# Patient Record
Sex: Male | Born: 1960 | Race: White | Hispanic: No | Smoking: Former smoker
Health system: Southern US, Community
[De-identification: ages and names within clinical notes are randomized; demographics above are authoritative.]

## PROBLEM LIST (undated history)

## (undated) DIAGNOSIS — I739 Peripheral vascular disease, unspecified: Secondary | ICD-10-CM

## (undated) DIAGNOSIS — T8859XA Other complications of anesthesia, initial encounter: Secondary | ICD-10-CM

## (undated) DIAGNOSIS — K219 Gastro-esophageal reflux disease without esophagitis: Secondary | ICD-10-CM

## (undated) DIAGNOSIS — F419 Anxiety disorder, unspecified: Secondary | ICD-10-CM

## (undated) DIAGNOSIS — F32A Depression, unspecified: Secondary | ICD-10-CM

## (undated) DIAGNOSIS — S43439A Superior glenoid labrum lesion of unspecified shoulder, initial encounter: Secondary | ICD-10-CM

## (undated) DIAGNOSIS — K5790 Diverticulosis of intestine, part unspecified, without perforation or abscess without bleeding: Secondary | ICD-10-CM

## (undated) DIAGNOSIS — M1990 Unspecified osteoarthritis, unspecified site: Secondary | ICD-10-CM

## (undated) DIAGNOSIS — M5126 Other intervertebral disc displacement, lumbar region: Secondary | ICD-10-CM

## (undated) DIAGNOSIS — F329 Major depressive disorder, single episode, unspecified: Secondary | ICD-10-CM

## (undated) DIAGNOSIS — J45909 Unspecified asthma, uncomplicated: Secondary | ICD-10-CM

## (undated) DIAGNOSIS — C801 Malignant (primary) neoplasm, unspecified: Secondary | ICD-10-CM

## (undated) DIAGNOSIS — J449 Chronic obstructive pulmonary disease, unspecified: Secondary | ICD-10-CM

## (undated) HISTORY — DX: Depression, unspecified: F32.A

## (undated) HISTORY — DX: Anxiety disorder, unspecified: F41.9

## (undated) HISTORY — DX: Major depressive disorder, single episode, unspecified: F32.9

---

## 2014-06-19 ENCOUNTER — Other Ambulatory Visit: Payer: Self-pay | Admitting: *Deleted

## 2014-06-19 ENCOUNTER — Encounter: Payer: Self-pay | Admitting: Surgery

## 2014-06-19 DIAGNOSIS — I70213 Atherosclerosis of native arteries of extremities with intermittent claudication, bilateral legs: Secondary | ICD-10-CM

## 2014-07-16 ENCOUNTER — Ambulatory Visit (INDEPENDENT_AMBULATORY_CARE_PROVIDER_SITE_OTHER): Payer: 59 | Admitting: Cardiovascular Disease

## 2014-07-16 ENCOUNTER — Encounter: Payer: Self-pay | Admitting: Cardiovascular Disease

## 2014-07-16 ENCOUNTER — Ambulatory Visit (HOSPITAL_COMMUNITY): Payer: 59 | Attending: Cardiovascular Disease | Admitting: Cardiology

## 2014-07-16 VITALS — BP 112/66 | HR 62 | Ht 72.0 in | Wt 203.8 lb

## 2014-07-16 DIAGNOSIS — I700 Atherosclerosis of aorta: Secondary | ICD-10-CM

## 2014-07-16 DIAGNOSIS — I739 Peripheral vascular disease, unspecified: Secondary | ICD-10-CM | POA: Insufficient documentation

## 2014-07-16 DIAGNOSIS — Z720 Tobacco use: Secondary | ICD-10-CM

## 2014-07-16 LAB — BASIC METABOLIC PANEL
BUN: 12 mg/dL (ref 6–23)
CO2: 29 mEq/L (ref 19–32)
Calcium: 9.8 mg/dL (ref 8.4–10.5)
Chloride: 105 mEq/L (ref 96–112)
Creatinine, Ser: 1.08 mg/dL (ref 0.40–1.50)
GFR: 75.13 mL/min (ref >60.00)
Glucose, Bld: 96 mg/dL (ref 70–99)
Potassium: 4 mEq/L (ref 3.5–5.1)
Sodium: 138 mEq/L (ref 135–145)

## 2014-07-16 LAB — CBC
HCT: 51.7 % (ref 39.0–52.0)
Hemoglobin: 17.4 g/dL — ABNORMAL HIGH (ref 13.0–17.0)
MCHC: 33.7 g/dL (ref 30.0–36.0)
MCV: 86.5 fl (ref 78.0–100.0)
Platelets: 240 10*3/uL (ref 150.0–400.0)
RBC: 5.98 Mil/uL — ABNORMAL HIGH (ref 4.22–5.81)
RDW: 13.5 % (ref 11.5–15.5)
WBC: 10.9 10*3/uL — ABNORMAL HIGH (ref 4.0–10.5)

## 2014-07-16 LAB — PROTIME-INR
INR: 1 ratio (ref 0.8–1.0)
Prothrombin Time: 10.9 s (ref 9.6–13.1)

## 2014-07-16 MED ORDER — ASPIRIN EC 81 MG PO TBEC: 81.0000 mg | DELAYED_RELEASE_TABLET | Freq: Every day | ORAL | Status: AC

## 2014-07-16 NOTE — Progress Notes (Signed)
Abdominal Aorta Duplex performed  

## 2014-07-16 NOTE — Patient Instructions (Addendum)
Medication Instructions:  Your physician has recommended you make the following change in your medication:  1. START Aspirin 81mg  take one by mouth daily  Labwork: Your physician recommends that you have lab work today: CBC, BMP, PT/INR  Testing/Procedures: Your physician has recommended an aorto-iliac duplex.   Your physician has requested that you have a peripheral vascular angiogram. This exam is performed at the hospital. During this exam IV contrast is used to look at arterial blood flow. Please review the information sheet given for details.  Follow-Up: We will arrange further follow-up after your angiogram.   Any Other Special Instructions Will Be Listed Below (If Applicable).

## 2014-07-19 ENCOUNTER — Encounter: Payer: Self-pay | Admitting: Vascular Surgery

## 2014-07-19 ENCOUNTER — Other Ambulatory Visit (HOSPITAL_COMMUNITY): Payer: Self-pay

## 2014-07-20 ENCOUNTER — Encounter: Payer: Self-pay | Admitting: Cardiovascular Disease

## 2014-07-20 DIAGNOSIS — Z720 Tobacco use: Secondary | ICD-10-CM | POA: Insufficient documentation

## 2014-07-20 DIAGNOSIS — I739 Peripheral vascular disease, unspecified: Secondary | ICD-10-CM | POA: Insufficient documentation

## 2014-07-20 NOTE — Assessment & Plan Note (Signed)
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 His symptoms are significantly lifestyle limiting. I discussed with him the natural history of claudication and different management options. I advised him to start taking aspirin 81 mg once daily. Given the significant limitations due to this, I recommend proceeding with abdominal aortogram, lower extremity runoff and possible endovascular intervention. Risks and benefits were discussed with the patient. I requested an aortoiliac duplex to be done before the procedure.

## 2014-07-20 NOTE — Progress Notes (Signed)
Primary care physician: Dr. Sasser  HPI  This is a pleasant 54-year-old male who was referred for evaluation and management of claudication and peripheral arterial disease. He has no previous cardiac history. He has prolonged history of tobacco use and reports smoking one and a half pack per day since he was 54 years old. He is currently in the process of quitting. He has no history of diabetes, hypertension or hyperlipidemia. He is not aware of any previous cardiac history. He reports progressive bilateral lower back and thigh numbness and heaviness with minimal walking. He starts having discomfort after walking about 50-100 feet and then he has to rest for he can resume. He denies any chest pain or shortness of breath. He had noninvasive vascular evaluation done at [facility] which showed a right ABI of 0.67 and left ABI of 0.88 with monophasic waveform throughout suggestive of inflow disease worse on the right side. He has no lower extremity ulceration.  No Known Allergies   Current Outpatient Prescriptions on File Prior to Visit  Medication Sig Dispense Refill  . clonazePAM (KLONOPIN) 1 MG tablet Take 1 mg by mouth 3 (three) times daily as needed for anxiety.    . pantoprazole (PROTONIX) 40 MG tablet Take 40 mg by mouth daily.     No current facility-administered medications on file prior to visit.     Past Medical History  Diagnosis Date  . Depression   . Anxiety      No past surgical history on file.   Family History  Problem Relation Age of Onset  . Diabetes Mother   . Cancer Father     Throat     History   Social History  . Marital Status: Legally Separated    Spouse Name: N/A  . Number of Children: N/A  . Years of Education: N/A   Occupational History  . Not on file.   Social History Main Topics  . Smoking status: Light Tobacco Smoker  . Smokeless tobacco: Not on file  . Alcohol Use: No  . Drug Use: No  . Sexual Activity: Not on file   Other  Topics Concern  . Not on file   Social History Narrative     ROS A 10 point review of system was performed. It is negative other than that mentioned in the history of present illness.   PHYSICAL EXAM   BP 112/66 mmHg  Pulse 62  Ht 6' (1.829 m)  Wt 203 lb 12.8 oz (92.443 kg)  BMI 27.63 kg/m2 Constitutional: He is oriented to person, place, and time. He appears well-developed and well-nourished. No distress.  HENT: No nasal discharge.  Head: Normocephalic and atraumatic.  Eyes: Pupils are equal and round.  No discharge. Neck: Normal range of motion. Neck supple. No JVD present. No thyromegaly present.  Cardiovascular: Normal rate, regular rhythm, normal heart sounds. Exam reveals no gallop and no friction rub. No murmur heard.  Pulmonary/Chest: Effort normal and breath sounds normal. No stridor. No respiratory distress. He has no wheezes. He has no rales. He exhibits no tenderness.  Abdominal: Soft. Bowel sounds are normal. He exhibits no distension. There is no tenderness. There is no rebound and no guarding.  Musculoskeletal: Normal range of motion. He exhibits no edema and no tenderness.  Neurological: He is alert and oriented to person, place, and time. Coordination normal.  Skin: Skin is warm and dry. No rash noted. He is not diaphoretic. No erythema. No pallor.  Psychiatric: He has a normal   mood and affect. His behavior is normal. Judgment and thought content normal.  Vascular: Radial pulses: +2 on the right and +1 on the left. Femoral pulses: Very faint on the right side and barely palpable, +1 on the left side. Distal pulses are not palpable.     EKG: Normal sinus rhythm with no significant ST or T wave changes.   ASSESSMENT AND PLAN    

## 2014-07-20 NOTE — Assessment & Plan Note (Signed)
He is quitting smoking and I explained to him the importance of complete abstinence given the significantly worse outcome in patients with PAD.

## 2014-07-24 ENCOUNTER — Encounter (HOSPITAL_COMMUNITY): Payer: Self-pay | Admitting: Cardiovascular Disease

## 2014-07-24 ENCOUNTER — Ambulatory Visit (HOSPITAL_COMMUNITY)
Admission: RE | Admit: 2014-07-24 | Discharge: 2014-07-24 | Disposition: A | Discharge status: Home / Self Care | Payer: 59 | Source: Ambulatory Visit | Attending: Cardiovascular Disease | Admitting: Cardiovascular Disease

## 2014-07-24 ENCOUNTER — Other Ambulatory Visit: Payer: Self-pay | Admitting: Cardiovascular Disease

## 2014-07-24 ENCOUNTER — Encounter (HOSPITAL_COMMUNITY)
Admission: RE | Disposition: A | Discharge status: Home / Self Care | Payer: Self-pay | Source: Ambulatory Visit | Attending: Cardiovascular Disease

## 2014-07-24 DIAGNOSIS — F419 Anxiety disorder, unspecified: Secondary | ICD-10-CM | POA: Diagnosis not present

## 2014-07-24 DIAGNOSIS — I70213 Atherosclerosis of native arteries of extremities with intermittent claudication, bilateral legs: Secondary | ICD-10-CM | POA: Insufficient documentation

## 2014-07-24 DIAGNOSIS — I700 Atherosclerosis of aorta: Secondary | ICD-10-CM | POA: Insufficient documentation

## 2014-07-24 DIAGNOSIS — Z79899 Other long term (current) drug therapy: Secondary | ICD-10-CM | POA: Insufficient documentation

## 2014-07-24 DIAGNOSIS — F17200 Nicotine dependence, unspecified, uncomplicated: Secondary | ICD-10-CM | POA: Insufficient documentation

## 2014-07-24 DIAGNOSIS — I739 Peripheral vascular disease, unspecified: Secondary | ICD-10-CM

## 2014-07-24 DIAGNOSIS — I745 Embolism and thrombosis of iliac artery: Secondary | ICD-10-CM | POA: Insufficient documentation

## 2014-07-24 HISTORY — PX: PERCUTANEOUS STENT INTERVENTION: SHX5500

## 2014-07-24 HISTORY — PX: ABDOMINAL AORTAGRAM: SHX5454

## 2014-07-24 LAB — POCT ACTIVATED CLOTTING TIME: Activated Clotting Time: 251 seconds

## 2014-07-24 SURGERY — ABDOMINAL AORTAGRAM

## 2014-07-24 MED ORDER — CLOPIDOGREL BISULFATE 300 MG PO TABS
ORAL_TABLET | ORAL | Status: AC
  Filled 2014-07-24: qty 2

## 2014-07-24 MED ORDER — ASPIRIN 81 MG PO CHEW
81.0000 mg | CHEWABLE_TABLET | ORAL | Status: AC
  Administered 2014-07-24: 81 mg via ORAL

## 2014-07-24 MED ORDER — LIDOCAINE HCL (PF) 1 % IJ SOLN
INTRAMUSCULAR | Status: AC
  Filled 2014-07-24: qty 30

## 2014-07-24 MED ORDER — FAMOTIDINE IN NACL 20-0.9 MG/50ML-% IV SOLN
INTRAVENOUS | Status: AC
  Filled 2014-07-24: qty 50

## 2014-07-24 MED ORDER — HEPARIN (PORCINE) IN NACL 2-0.9 UNIT/ML-% IJ SOLN
INTRAMUSCULAR | Status: AC
  Filled 2014-07-24: qty 1000

## 2014-07-24 MED ORDER — SODIUM CHLORIDE 0.9 % IJ SOLN: 3.0000 mL | Freq: Two times a day (BID) | INTRAMUSCULAR | Status: DC

## 2014-07-24 MED ORDER — SODIUM CHLORIDE 0.9 % IJ SOLN: 3.0000 mL | INTRAMUSCULAR | Status: DC | PRN

## 2014-07-24 MED ORDER — HEPARIN SODIUM (PORCINE) 1000 UNIT/ML IJ SOLN
INTRAMUSCULAR | Status: AC
  Filled 2014-07-24: qty 1

## 2014-07-24 MED ORDER — ASPIRIN 81 MG PO CHEW
CHEWABLE_TABLET | ORAL | Status: AC
  Filled 2014-07-24: qty 1

## 2014-07-24 MED ORDER — MIDAZOLAM HCL 2 MG/2ML IJ SOLN
INTRAMUSCULAR | Status: AC
  Filled 2014-07-24: qty 2

## 2014-07-24 MED ORDER — CLOPIDOGREL BISULFATE 75 MG PO TABS: 75.0000 mg | ORAL_TABLET | Freq: Every day | ORAL | Status: DC

## 2014-07-24 MED ORDER — FENTANYL CITRATE (PF) 100 MCG/2ML IJ SOLN
INTRAMUSCULAR | Status: AC
  Filled 2014-07-24: qty 2

## 2014-07-24 MED ORDER — SODIUM CHLORIDE 0.9 % IV SOLN
INTRAVENOUS | Status: DC
  Administered 2014-07-24: 07:00:00 via INTRAVENOUS

## 2014-07-24 MED ORDER — SODIUM CHLORIDE 0.9 % IV SOLN
INTRAVENOUS | Status: DC
  Administered 2014-07-24: 11:00:00 via INTRAVENOUS

## 2014-07-24 MED ORDER — SODIUM CHLORIDE 0.9 % IV SOLN: 250.0000 mL | INTRAVENOUS | Status: DC | PRN

## 2014-07-24 NOTE — Discharge Instructions (Signed)
Start Plavix 75 mg once daily. A prescription was sent to [facility] in [place].   Angiogram, Care After Refer to this sheet in the next few weeks. These instructions provide you with information on caring for yourself after your procedure. Your health care provider may also give you more specific instructions. Your treatment has been planned according to current medical practices, but problems sometimes occur. Call your health care provider if you have any problems or questions after your procedure.  WHAT TO EXPECT AFTER THE PROCEDURE After your procedure, it is typical to have the following sensations:  Minor discomfort or tenderness and a small bump at the catheter insertion site. The bump should usually decrease in size and tenderness within 1 to 2 weeks.  Any bruising will usually fade within 2 to 4 weeks. HOME CARE INSTRUCTIONS   You may need to keep taking blood thinners if they were prescribed for you. Take medicines only as directed by your health care provider.  Do not apply powder or lotion to the site.  Do not take baths, swim, or use a hot tub until your health care provider approves.  You may shower 24 hours after the procedure. Remove the bandage (dressing) and gently wash the site with plain soap and water. Gently pat the site dry.  Inspect the site at least twice daily.  Limit your activity for the first 48 hours. Do not bend, squat, or lift anything over 20 lb (9 kg) or as directed by your health care provider.  Plan to have someone take you home after the procedure. Follow instructions about when you can drive or return to work. SEEK MEDICAL CARE IF:  You get light-headed when standing up.  You have drainage (other than a small amount of blood on the dressing).  You have chills.  You have a fever.  You have redness, warmth, swelling, or pain at the insertion site. SEEK IMMEDIATE MEDICAL CARE IF:   You develop chest pain or shortness of breath, feel faint, or pass  out.  You have bleeding, swelling larger than a walnut, or drainage from the catheter insertion site.  You develop pain, discoloration, coldness, or severe bruising in the leg or arm that held the catheter.  You develop bleeding from any other place, such as the bowels. You may see bright red blood in your urine or stools, or your stools may appear black and tarry.  You have heavy bleeding from the site. If this happens, hold pressure on the site. CALL 911 MAKE SURE YOU:  Understand these instructions.  Will watch your condition.  Will get help right away if you are not doing well or get worse. Document Released: 10/01/2004 Document Revised: 07/30/2013 Document Reviewed: 08/07/2012 [facility] Patient Information 2015 [employer] [employer] [place]. This information is not intended to replace advice given to you by your health care provider. Make sure you discuss any questions you have with your health care provider.

## 2014-07-24 NOTE — Progress Notes (Signed)
Per Dr Kirke CorinArida ok to d/c home now

## 2014-07-24 NOTE — Interval H&P Note (Signed)
History and Physical Interval Note:  07/24/2014 8:47 AM  Terrence Swanson  has presented today for surgery, with the diagnosis of pad  The various methods of treatment have been discussed with the patient and family. After consideration of risks, benefits and other options for treatment, the patient has consented to  Procedure(s): ABDOMINAL AORTAGRAM (N/A) as a surgical intervention .  The patient's history has been reviewed, patient examined, no change in status, stable for surgery.  I have reviewed the patient's chart and labs.  Questions were answered to the patient's satisfaction.     Lorine BearsMuhammad Arida

## 2014-07-24 NOTE — CV Procedure (Signed)
PERIPHERAL VASCULAR PROCEDURE  NAME:  Terrence Swanson   MRN: [id number] DOB:  1961-01-11   ADMIT DATE: 07/24/2014  Performing Cardiologist: Kathlyn Sacramento Primary Physician: Manon Hilding, MD  Procedures Performed:  Abdominal Aortic Angiogram with Bi-Iliofemoral Runoff  Bilateral Lower Extremity Angiography (1st Order)  Right common iliac artery angioplasty  Bilateral common iliac artery kissing stents placement  Mynx closure device bilaterally   Indication(s):   Claudication    Consent: The procedure with Risks/Benefits/Alternatives and Indications was reviewed with the patient .  All questions were answered.  Medications:  Sedation:  1 mg IV Versed, 50 mcg IV Fentanyl  Contrast:  150 ml  Visipaque   Procedural details: The right groin was prepped, draped, and anesthetized with 1% lidocaine. Using modified Seldinger technique, a 4 micropuncture [place]  sheath was introduced into the right  common femoral artery.  this was exchanged into a 5 [place] sheath.A 5 Fr Short Pigtail Catheter was advanced of over a  Versicore wire into the descending Aorta to a level just above the renal arteries. A power injection of 71m/sec contrast over 1 sec was performed for Abdominal Aortic Angiography.  The catheter was then pulled back to a level just above the Aortic bifurcation, and a second power injection was performed to evaluate the iliac arteries.   Bilateral lower extremity arterial run off was then performed via power injection of 7  ml / sec contrast for a total of 77  Ml. This was performed after performing bilateral iliac stenting.   Interventional Procedure:  The left groin was prepped and anesthetized with 1% lidocaine. A 4 French micropuncture sheath was placed in the left common femoral artery. This was exchanged into a 7 French bright tip sheath. The 5 French sheath in the right common femoral artery was exchanged into a 7 French bright tip sheath. 7000 units of  unfractionated heparin was given with an ACT of 250. There was subtotal occlusion of the right common iliac artery. The lesion was predilated with a 6 x 20 mm balloon to 8 atm. I then placed an 8 x 38 mm Genesis balloon expandable stent in the right common iliac artery and 8 x 24 mm Genesis balloon expandable stent in the left common iliac artery. Both stents extended into the distal aorta for a few millimeters. They will deployed simultaneously to 10 atm. Final angiography showed excellent results with 0% residual stenosis on the left and less than 10% residual stenosis on the right. There was calcium shadowing over the right stent but there was no residual gradient noted. Hemostasis was achieved with a a Mynx closure device bilaterally. The patient tolerated the procedure well with no immediate complications.   Hemodynamics:  Central Aortic Pressure / Mean Aortic Pressure: 110/70   Findings:  Abdominal aorta: Normal in size with no evidence of aneurysm. There is diffuse atherosclerosis and calcifications especially in the distal segment above the iliac bifurcation with 30% distal stenosis   Left renal artery: Normal   Right renal artery: Normal   Celiac artery: Patent   Superior mesenteric artery: Patent   Right common iliac artery: 99% proximal stenosis.   Right internal iliac artery: Mild diffuse atherosclerosis.   Right external iliac artery: Minor irregularities.   Right common femoral artery: Normal   Right profunda femoral artery: Normal   Right superficial femoral artery: Minor irregularities.   Right popliteal artery: Minor irregularities.   Three-vessel runoff below the knee. These were not visualized all the way  distally due to malfunction in the equipment.   Left common iliac artery:  80% stenosis at the ostium.   Left internal iliac artery: Mild diffuse atherosclerosis.   Left external iliac artery: Normal   Left common femoral artery: Normal   Left profunda  femoral artery: Normal   Left superficial femoral artery:  Minor irregularities.   Left popliteal artery: Minor irregularities. With 20% mid stenosis.  Three-vessel runoff below the knee. These were not visualized all the way distally due to malfunction in the equipment.   Conclusions: 1. 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  2. No significant infrainguinal disease.  3. Successful kissing stent placement to the bilateral common iliac arteries extending into the distal aorta   Recommendations:  Aggressive treatment of risk factors and smoking cessation. Dual antiplatelet therapy for at least one month.  Kathlyn Sacramento, MD, [facility] 07/24/2014 10:17 AM

## 2014-07-24 NOTE — H&P (View-Only) (Signed)
Primary care physician: Dr. Neita CarpSasser  HPI  This is a pleasant 54 year old male who was referred for evaluation and management of claudication and peripheral arterial disease. He has no previous cardiac history. He has prolonged history of tobacco use and reports smoking one and a half pack per day since he was 54 years old. He is currently in the process of quitting. He has no history of diabetes, hypertension or hyperlipidemia. He is not aware of any previous cardiac history. He reports progressive bilateral lower back and thigh numbness and heaviness with minimal walking. He starts having discomfort after walking about 50-100 feet and then he has to rest for he can resume. He denies any chest pain or shortness of breath. [facility] which showed a right ABI of 0.67 and left ABI of 0.88 with monophasic waveform throughout suggestive of inflow disease worse on the right side. He has no lower extremity ulceration.  No Known Allergies   Current Outpatient Prescriptions on File Prior to Visit  Medication Sig Dispense Refill  . clonazePAM (KLONOPIN) 1 MG tablet Take 1 mg by mouth 3 (three) times daily as needed for anxiety.    . pantoprazole (PROTONIX) 40 MG tablet Take 40 mg by mouth daily.     No current facility-administered medications on file prior to visit.     Past Medical History  Diagnosis Date  . Depression   . Anxiety      No past surgical history on file.   Family History  Problem Relation Age of Onset  . Diabetes Mother   . Cancer Father     Throat     History   Social History  . Marital Status: Legally Separated    Spouse Name: N/A  . Number of Children: N/A  . Years of Education: N/A   Occupational History  . Not on file.   Social History Main Topics  . Smoking status: Light Tobacco Smoker  . Smokeless tobacco: Not on file  . Alcohol Use: No  . Drug Use: No  . Sexual Activity: Not on file   Other  Topics Concern  . Not on file   Social History Narrative     ROS A 10 point review of system was performed. It is negative other than that mentioned in the history of present illness.   PHYSICAL EXAM   BP 112/66 mmHg  Pulse 62  Ht 6' (1.829 m)  Wt 203 lb 12.8 oz (92.443 kg)  BMI 27.63 kg/m2 Constitutional: He is oriented to person, place, and time. He appears well-developed and well-nourished. No distress.  HENT: No nasal discharge.  Head: Normocephalic and atraumatic.  Eyes: Pupils are equal and round.  No discharge. Neck: Normal range of motion. Neck supple. No JVD present. No thyromegaly present.  Cardiovascular: Normal rate, regular rhythm, normal heart sounds. Exam reveals no gallop and no friction rub. No murmur heard.  Pulmonary/Chest: Effort normal and breath sounds normal. No stridor. No respiratory distress. He has no wheezes. He has no rales. He exhibits no tenderness.  Abdominal: Soft. Bowel sounds are normal. He exhibits no distension. There is no tenderness. There is no rebound and no guarding.  Musculoskeletal: Normal range of motion. He exhibits no edema and no tenderness.  Neurological: He is alert and oriented to person, place, and time. Coordination normal.  Skin: Skin is warm and dry. No rash noted. He is not diaphoretic. No erythema. No pallor.  Psychiatric: He has a normal  mood and affect. His behavior is normal. Judgment and thought content normal.  Vascular: Radial pulses: +2 on the right and +1 on the left. Femoral pulses: Very faint on the right side and barely palpable, +1 on the left side. Distal pulses are not palpable.     EKG: Normal sinus rhythm with no significant ST or T wave changes.   ASSESSMENT AND PLAN

## 2014-07-29 ENCOUNTER — Other Ambulatory Visit: Payer: Self-pay | Admitting: Cardiovascular Disease

## 2014-07-29 ENCOUNTER — Telehealth: Payer: Self-pay | Admitting: Cardiovascular Disease

## 2014-07-29 DIAGNOSIS — I739 Peripheral vascular disease, unspecified: Secondary | ICD-10-CM

## 2014-07-29 NOTE — Telephone Encounter (Signed)
I spoke with the pt and he complained of bruising at his right and left groin sites.  The pt denies pain or "hard areas" at these sites but does have some tenderness.  I made the pt aware that bruising can occur after an angiogram and the pt had multiple sheath change outs with procedure. The pt does have hospital follow-up arranged and he will contact the office if he has any other questions or concerns.

## 2014-07-29 NOTE — Telephone Encounter (Signed)
New message     Pt had 2 stents put in on 07-24-14.  His groin area has 2 big bruises----approx 10X12 inches.  Is this normal?   Pt states he is on blood thinner.  It is not painful.

## 2014-07-29 NOTE — Telephone Encounter (Signed)
Ok

## 2014-07-30 ENCOUNTER — Encounter (HOSPITAL_COMMUNITY): Payer: 59

## 2014-08-06 ENCOUNTER — Ambulatory Visit (HOSPITAL_COMMUNITY): Payer: 59 | Attending: Cardiovascular Disease

## 2014-08-06 ENCOUNTER — Encounter: Payer: 59 | Admitting: Cardiovascular Disease

## 2014-08-06 DIAGNOSIS — I739 Peripheral vascular disease, unspecified: Secondary | ICD-10-CM

## 2014-08-12 ENCOUNTER — Encounter: Payer: Self-pay | Admitting: Surgery

## 2014-08-12 ENCOUNTER — Other Ambulatory Visit (HOSPITAL_COMMUNITY): Payer: Self-pay

## 2014-08-13 ENCOUNTER — Ambulatory Visit (INDEPENDENT_AMBULATORY_CARE_PROVIDER_SITE_OTHER): Payer: 59 | Admitting: Cardiovascular Disease

## 2014-08-13 ENCOUNTER — Encounter: Payer: Self-pay | Admitting: Cardiovascular Disease

## 2014-08-13 VITALS — BP 108/58 | HR 68 | Ht 72.0 in | Wt 200.0 lb

## 2014-08-13 DIAGNOSIS — E785 Hyperlipidemia, unspecified: Secondary | ICD-10-CM | POA: Diagnosis not present

## 2014-08-13 DIAGNOSIS — Z720 Tobacco use: Secondary | ICD-10-CM

## 2014-08-13 DIAGNOSIS — I739 Peripheral vascular disease, unspecified: Secondary | ICD-10-CM | POA: Diagnosis not present

## 2014-08-13 LAB — HEPATIC FUNCTION PANEL
ALT: 13 U/L (ref 0–53)
AST: 18 U/L (ref 0–37)
Albumin: 4.1 g/dL (ref 3.5–5.2)
Alkaline Phosphatase: 119 U/L — ABNORMAL HIGH (ref 39–117)
Bilirubin, Direct: 0.2 mg/dL (ref 0.0–0.3)
Total Bilirubin: 0.6 mg/dL (ref 0.2–1.2)
Total Protein: 7.3 g/dL (ref 6.0–8.3)

## 2014-08-13 LAB — LIPID PANEL
Cholesterol: 170 mg/dL (ref 0–200)
HDL: 30.8 mg/dL — ABNORMAL LOW (ref >39.00)
LDL Cholesterol: 110 mg/dL — ABNORMAL HIGH (ref 0–99)
NonHDL: 139.2
Total CHOL/HDL Ratio: 6
Triglycerides: 145 mg/dL (ref 0.0–149.0)
VLDL: 29 mg/dL (ref 0.0–40.0)

## 2014-08-13 NOTE — Assessment & Plan Note (Signed)
I discussed with him the importance of complete smoking cessation.

## 2014-08-13 NOTE — Patient Instructions (Signed)
Medication Instructions:  Your physician recommends that you continue on your current medications as directed. Please refer to the Current Medication list given to you today.  Labwork: Your physician recommends that you have lab work today: LIPID and LIVER  Testing/Procedures: No new orders.  Follow-Up: Your physician wants you to follow-up in: 6 MONTHS with Dr Kirke CorinArida.  You will receive a reminder letter in the mail two months in advance. If you don't receive a letter, please call our office to schedule the follow-up appointment.  Any Other Special Instructions Will Be Listed Below (If Applicable).

## 2014-08-13 NOTE — Assessment & Plan Note (Signed)
Due to peripheral arterial disease, we should have a low threshold for treatment with a statin. I requested fasting lipid and liver profile.

## 2014-08-13 NOTE — Progress Notes (Signed)
Primary care physician: Dr. Neita CarpSasser  HPI  This is a pleasant 54 year old male who is here today for a follow-up visit regarding  peripheral arterial disease. He has no previous cardiac history. He has prolonged history of tobacco use and reports smoking one and a half pack per day since he was 54 years old. He is currently in the process of quitting. He has no history of diabetes, hypertension or hyperlipidemia. He was seen recently for progressive bilateral lower back and thigh numbness and heaviness with minimal walking.  [facility] which showed a right ABI of 0.67 and left ABI of 0.88 with monophasic waveform throughout suggestive of inflow disease worse on the right side.  I proceeded with angiography in April which showed: 1. 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  2. No significant infrainguinal disease.  3. Successful kissing stent placement to the bilateral common iliac arteries extending into the distal aorta   He had bilateral groin bruising with no significant hematoma. He has not done much physical activities but reports no claudication.    No Known Allergies   Current Outpatient Prescriptions on File Prior to Visit  Medication Sig Dispense Refill  . aspirin EC 81 MG tablet Take 1 tablet (81 mg total) by mouth daily.    . clonazePAM (KLONOPIN) 1 MG tablet Take 1 mg by mouth 3 (three) times daily as needed for anxiety.    . clopidogrel (PLAVIX) 75 MG tablet Take 1 tablet (75 mg total) by mouth daily. 30 tablet 3  . pantoprazole (PROTONIX) 40 MG tablet Take 40 mg by mouth daily.     No current facility-administered medications on file prior to visit.     Past Medical History  Diagnosis Date  . Depression   . Anxiety      Past Surgical History  Procedure Laterality Date  . Abdominal aortagram N/A 07/24/2014    Procedure: ABDOMINAL Ronny FlurryAORTAGRAM;  Surgeon: Iran OuchMuhammad A  Tagan Bartram, MD;  [facility];  Service: Cardiovascular;  Laterality: N/A;  . Percutaneous stent intervention N/A 07/24/2014    Procedure: PERCUTANEOUS STENT INTERVENTION;  Surgeon: Iran OuchMuhammad A Dashae Wilcher, MD;  Location: [facility] CATH LAB;  Service: Cardiovascular;  Laterality: N/A;  rt and left common iliacs     Family History  Problem Relation Age of Onset  . Diabetes Mother   . Cancer Father     Throat     History   Social History  . Marital Status: Legally Separated    Spouse Name: N/A  . Number of Children: N/A  . Years of Education: N/A   Occupational History  . Not on file.   Social History Main Topics  . Smoking status: Light Tobacco Smoker  . Smokeless tobacco: Not on file  . Alcohol Use: No  . Drug Use: No  . Sexual Activity: Not on file   Other Topics Concern  . Not on file   Social History Narrative     ROS A 10 point review of system was performed. It is negative other than that mentioned in the history of present illness.   PHYSICAL EXAM   BP 108/58 mmHg  Pulse 68  Ht 6' (1.829 m)  Wt 200 lb (90.719 kg)  BMI 27.12 kg/m2 Constitutional: He is oriented to person, place, and time. He appears well-developed and well-nourished. No distress.  HENT: No nasal discharge.  Head: Normocephalic and atraumatic.  Eyes: Pupils are equal and  round.  No discharge. Neck: Normal range of motion. Neck supple. No JVD present. No thyromegaly present.  Cardiovascular: Normal rate, regular rhythm, normal heart sounds. Exam reveals no gallop and no friction rub. No murmur heard.  Pulmonary/Chest: Effort normal and breath sounds normal. No stridor. No respiratory distress. He has no wheezes. He has no rales. He exhibits no tenderness.  Abdominal: Soft. Bowel sounds are normal. He exhibits no distension. There is no tenderness. There is no rebound and no guarding.  Musculoskeletal: Normal range of motion. He exhibits no edema and no tenderness.  Neurological: He is alert and  oriented to person, place, and time. Coordination normal.  Skin: Skin is warm and dry. No rash noted. He is not diaphoretic. No erythema. No pallor.  Psychiatric: He has a normal mood and affect. His behavior is normal. Judgment and thought content normal.  Vascular: Radial pulses: +2 on the right and +1 on the left. Femoral pulses: Close to normal bilaterally. Distal pulses are now palpable on both sides       ASSESSMENT AND PLAN

## 2014-08-13 NOTE — Assessment & Plan Note (Signed)
He is doing very well after recent bilateral common iliac artery angioplasty and stent placement. Postprocedure ABI is normal. Continue dual antiplatelet therapy for at least a few months. Schedule an aortoiliac duplex in 3 months.

## 2014-11-19 ENCOUNTER — Other Ambulatory Visit: Payer: Self-pay

## 2014-11-19 MED ORDER — CLOPIDOGREL BISULFATE 75 MG PO TABS: 75.0000 mg | ORAL_TABLET | Freq: Every day | ORAL | Status: DC

## 2014-12-04 ENCOUNTER — Other Ambulatory Visit: Payer: Self-pay | Admitting: Cardiovascular Disease

## 2014-12-04 DIAGNOSIS — I739 Peripheral vascular disease, unspecified: Secondary | ICD-10-CM

## 2014-12-04 DIAGNOSIS — I700 Atherosclerosis of aorta: Secondary | ICD-10-CM

## 2014-12-05 ENCOUNTER — Ambulatory Visit (INDEPENDENT_AMBULATORY_CARE_PROVIDER_SITE_OTHER): Payer: 59

## 2014-12-05 DIAGNOSIS — I700 Atherosclerosis of aorta: Secondary | ICD-10-CM

## 2014-12-05 DIAGNOSIS — I739 Peripheral vascular disease, unspecified: Secondary | ICD-10-CM

## 2015-02-18 ENCOUNTER — Encounter: Payer: Self-pay | Admitting: Cardiovascular Disease

## 2015-02-18 ENCOUNTER — Ambulatory Visit (INDEPENDENT_AMBULATORY_CARE_PROVIDER_SITE_OTHER): Payer: 59 | Admitting: Cardiovascular Disease

## 2015-02-18 VITALS — BP 114/70 | HR 73 | Ht 71.5 in | Wt 209.8 lb

## 2015-02-18 DIAGNOSIS — E785 Hyperlipidemia, unspecified: Secondary | ICD-10-CM

## 2015-02-18 DIAGNOSIS — Z720 Tobacco use: Secondary | ICD-10-CM

## 2015-02-18 DIAGNOSIS — I739 Peripheral vascular disease, unspecified: Secondary | ICD-10-CM | POA: Diagnosis not present

## 2015-02-18 NOTE — Progress Notes (Signed)
Primary care physician: Dr. Neita CarpSasser  HPI  This is a pleasant 54 year old male who is here today for a follow-up visit regarding  peripheral arterial disease. He has no previous cardiac history. He has prolonged history of tobacco use and reports smoking one and a half pack per day since he was 54 years old. He underwent kissing stent placement to the bilateral common iliac arteries extending into the distal aorta in April of this year for severe claudication. He has been doing very well with no recurrent symptoms. He is trying to stop smoking and he is down to half a pack per week now.     No Known Allergies   Current Outpatient Prescriptions on File Prior to Visit  Medication Sig Dispense Refill  . aspirin EC 81 MG tablet Take 1 tablet (81 mg total) by mouth daily.    . clonazePAM (KLONOPIN) 1 MG tablet Take 1 mg by mouth 3 (three) times daily as needed for anxiety.    . clopidogrel (PLAVIX) 75 MG tablet Take 1 tablet (75 mg total) by mouth daily. 30 tablet 3  . pantoprazole (PROTONIX) 40 MG tablet Take 40 mg by mouth daily.     No current facility-administered medications on file prior to visit.     Past Medical History  Diagnosis Date  . Depression   . Anxiety      Past Surgical History  Procedure Laterality Date  . Abdominal aortagram N/A 07/24/2014    Procedure: ABDOMINAL Ronny FlurryAORTAGRAM;  Surgeon: Iran OuchMuhammad A Mikhael Hendriks, MD;  [facility];  Service: Cardiovascular;  Laterality: N/A;  . Percutaneous stent intervention N/A 07/24/2014    Procedure: PERCUTANEOUS STENT INTERVENTION;  Surgeon: Iran OuchMuhammad A Anila Bojarski, MD;  Location: [facility]ATH [facility];  Service: Cardiovascular;  Laterality: N/A;  rt and left common iliacs     Family History  Problem Relation Age of Onset  . Diabetes Mother   . Cancer Father     Throat     Social History   Social History  . Marital Status: Legally Separated    Spouse Name: N/A  . Number of Children: N/A  . Years of Education: N/A   Occupational  History  . Not on file.   Social History Main Topics  . Smoking status: Light Tobacco Smoker  . Smokeless tobacco: Not on file  . Alcohol Use: No  . Drug Use: No  . Sexual Activity: Not on file   Other Topics Concern  . Not on file   Social History Narrative     ROS A 10 point review of system was performed. It is negative other than that mentioned in the history of present illness.   PHYSICAL EXAM   BP 114/70 mmHg  Pulse 73  Ht 5' 11.5" (1.816 m)  Wt 209 lb 12.8 oz (95.165 kg)  BMI 28.86 kg/m2  SpO2 96% Constitutional: He is oriented to person, place, and time. He appears well-developed and well-nourished. No distress.  HENT: No nasal discharge.  Head: Normocephalic and atraumatic.  Eyes: Pupils are equal and round.  No discharge. Neck: Normal range of motion. Neck supple. No JVD present. No thyromegaly present.  Cardiovascular: Normal rate, regular rhythm, normal heart sounds. Exam reveals no gallop and no friction rub. No murmur heard.  Pulmonary/Chest: Effort normal and breath sounds normal. No stridor. No respiratory distress. He has no wheezes. He has no rales. He exhibits no tenderness.  Abdominal: Soft. Bowel sounds are normal. He exhibits no distension. There is no tenderness. There  is no rebound and no guarding.  Musculoskeletal: Normal range of motion. He exhibits no edema and no tenderness.  Neurological: He is alert and oriented to person, place, and time. Coordination normal.  Skin: Skin is warm and dry. No rash noted. He is not diaphoretic. No erythema. No pallor.  Psychiatric: He has a normal mood and affect. His behavior is normal. Judgment and thought content normal.  Vascular: Radial pulses: +2 on the right and +1 on the left. Femoral pulses: Close to normal bilaterally. Distal pulses are now palpable on both sides       ASSESSMENT AND PLAN

## 2015-02-18 NOTE — Assessment & Plan Note (Signed)
He is cutting down on his own and I discussed with him the importance of complete cessation.

## 2015-02-18 NOTE — Assessment & Plan Note (Signed)
He is doing very well with no recurrent claudication. I discontinued Plavix today. I discussed with him the importance of lifestyle changes. Most recent duplex in September showed patent stents. Repeat in March 2017.

## 2015-02-18 NOTE — Assessment & Plan Note (Signed)
Lab Results  Component Value Date   CHOL 170 08/13/2014   HDL 30.80* 08/13/2014   LDLCALC 110* 08/13/2014   TRIG 145.0 08/13/2014   CHOLHDL 6 08/13/2014   Given that presence of peripheral arterial disease, I strongly advised him to consider treatment with a statin. I had a prolonged discussion with him about this and he elected to try with lifestyle changes. He does not like taking medications.

## 2015-02-18 NOTE — Patient Instructions (Signed)
Medication Instructions:  Your physician has recommended you make the following change in your medication:  1. STOP Plavix  Labwork: No new orders.   Testing/Procedures: Your physician has requested that you [facility] [facility]  Do not eat after midnight the day before and avoid carbonated beverages  Follow-Up: Your physician wants you to follow-up in: 1 YEAR with Dr Kirke CorinArida.  You will receive a reminder letter in the mail two months in advance. If you don't receive a letter, please call our office to schedule the follow-up appointment.   Any Other Special Instructions Will Be Listed Below (If Applicable).     If you need a refill on your cardiac medications before your next appointment, please call your pharmacy.

## 2015-04-16 ENCOUNTER — Other Ambulatory Visit: Payer: Self-pay | Admitting: Cardiovascular Disease

## 2015-04-16 DIAGNOSIS — I739 Peripheral vascular disease, unspecified: Secondary | ICD-10-CM

## 2015-05-28 ENCOUNTER — Ambulatory Visit: Payer: BLUE CROSS/BLUE SHIELD

## 2015-05-28 DIAGNOSIS — I739 Peripheral vascular disease, unspecified: Secondary | ICD-10-CM | POA: Diagnosis not present

## 2016-06-21 ENCOUNTER — Other Ambulatory Visit: Payer: Self-pay | Admitting: Cardiovascular Disease

## 2016-06-21 DIAGNOSIS — Z95828 Presence of other vascular implants and grafts: Secondary | ICD-10-CM

## 2016-06-24 ENCOUNTER — Ambulatory Visit: Payer: BLUE CROSS/BLUE SHIELD

## 2016-06-24 DIAGNOSIS — I739 Peripheral vascular disease, unspecified: Secondary | ICD-10-CM | POA: Diagnosis not present

## 2016-06-24 DIAGNOSIS — Z95828 Presence of other vascular implants and grafts: Secondary | ICD-10-CM

## 2016-07-09 ENCOUNTER — Telehealth: Payer: Self-pay | Admitting: Cardiovascular Disease

## 2016-07-09 NOTE — Telephone Encounter (Signed)
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 Schedule follow-up with me to discuss options.   I left a detailed message on the pt's voicemail per his request. Appointment scheduled on 07/20/16 at 9:00 with Dr Kirke Corin.

## 2016-07-09 NOTE — Telephone Encounter (Signed)
Returning Lauren's call from last week.

## 2016-07-09 NOTE — Telephone Encounter (Signed)
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 Patient states that you may leave a detailed message,thanks.

## 2016-07-14 ENCOUNTER — Encounter: Payer: Self-pay | Admitting: *Deleted

## 2016-07-20 ENCOUNTER — Encounter: Payer: Self-pay | Admitting: Cardiovascular Disease

## 2016-07-20 ENCOUNTER — Ambulatory Visit (INDEPENDENT_AMBULATORY_CARE_PROVIDER_SITE_OTHER): Payer: BLUE CROSS/BLUE SHIELD | Admitting: Cardiovascular Disease

## 2016-07-20 VITALS — BP 110/76 | HR 77 | Ht 71.0 in | Wt 201.0 lb

## 2016-07-20 DIAGNOSIS — E785 Hyperlipidemia, unspecified: Secondary | ICD-10-CM

## 2016-07-20 DIAGNOSIS — Z720 Tobacco use: Secondary | ICD-10-CM | POA: Diagnosis not present

## 2016-07-20 DIAGNOSIS — I739 Peripheral vascular disease, unspecified: Secondary | ICD-10-CM

## 2016-07-20 LAB — BASIC METABOLIC PANEL
BUN: 10 mg/dL (ref 7–25)
CO2: 20 mmol/L (ref 20–31)
Calcium: 9.9 mg/dL (ref 8.6–10.3)
Chloride: 105 mmol/L (ref 98–110)
Creat: 1.23 mg/dL (ref 0.70–1.33)
Glucose, Bld: 92 mg/dL (ref 65–99)
Potassium: 4.5 mmol/L (ref 3.5–5.3)
Sodium: 141 mmol/L (ref 135–146)

## 2016-07-20 LAB — CBC
HCT: 50.4 % — ABNORMAL HIGH (ref 38.5–50.0)
Hemoglobin: 17.2 g/dL — ABNORMAL HIGH (ref 13.2–17.1)
MCH: 30 pg (ref 27.0–33.0)
MCHC: 34.1 g/dL (ref 32.0–36.0)
MCV: 87.8 fL (ref 80.0–100.0)
MPV: 9.2 fL (ref 7.5–12.5)
Platelets: 230 10*3/uL (ref 140–400)
RBC: 5.74 MIL/uL (ref 4.20–5.80)
RDW: 13.8 % (ref 11.0–15.0)
WBC: 10 10*3/uL (ref 3.8–10.8)

## 2016-07-20 MED ORDER — ATORVASTATIN CALCIUM 40 MG PO TABS: 40.0000 mg | ORAL_TABLET | Freq: Every day | ORAL | 11 refills | Status: DC

## 2016-07-20 MED ORDER — CLOPIDOGREL BISULFATE 75 MG PO TABS: 75.0000 mg | ORAL_TABLET | Freq: Every day | ORAL | 11 refills | Status: DC

## 2016-07-20 NOTE — Progress Notes (Signed)
Cardiology Office Note   Date:  07/20/2016   ID:  Diyari, Cherne May 07, 1960, MRN [id number]  PCP:  Estanislado Pandy, MD  Cardiologist:   [name], MD   No chief complaint on file.     History of Present Illness: Nesanel Aguila is a 56 y.o. male who presents for a follow-up visit regarding  peripheral arterial disease. He has no previous cardiac history. He has prolonged history of tobacco use and reports smoking one and a half pack per day since he was 56 years old. He underwent kissing stent placement to the bilateral common iliac arteries extending into the distal aorta in April, 2016 of this year for severe claudication. He had recent noninvasive vascular studies done which showed mildly reduced ABI with significant in-stent restenosis in bilateral common iliac arteries. 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 This has significantly affected his ability to perform his work. The claudication happens after walking about 100 feet. He quit smoking recently. He has been under significant stress as he reports that his son is alcoholic. He denies any chest pain or shortness of breath.   Past Medical History:  Diagnosis Date  . Anxiety   . Depression     Past Surgical History:  Procedure Laterality Date  . ABDOMINAL AORTAGRAM N/A 07/24/2014   Procedure: ABDOMINAL Ronny Flurry;  Surgeon: Iran Ouch, MD;  [facility];  Service: Cardiovascular;  Laterality: N/A;  . PERCUTANEOUS STENT INTERVENTION N/A 07/24/2014   Procedure: PERCUTANEOUS STENT INTERVENTION;  Surgeon: Iran Ouch, MD;  Location: [facility];  Service: Cardiovascular;  Laterality: N/A;  rt and left common iliacs     Current Outpatient Prescriptions  Medication Sig Dispense Refill  . aspirin EC 81 MG tablet Take 1 tablet (81 mg total) by mouth daily.    . clonazePAM (KLONOPIN) 1 MG tablet Take 1 mg by  mouth 3 (three) times daily as needed for anxiety.    . pantoprazole (PROTONIX) 40 MG tablet Take 40 mg by mouth daily.     No current facility-administered medications for this visit.     Allergies:   Patient has no known allergies.    Social History:  The patient  reports that he has been smoking.  He has never used smokeless tobacco. He reports that he does not drink alcohol or use drugs.   Family History:  The patient's family history includes Cancer in his father; Diabetes in his mother.    ROS:  Please see the history of present illness.   Otherwise, review of systems are positive for none.   All other systems are reviewed and negative.    PHYSICAL EXAM: VS:  BP 110/76   Pulse 77   Ht  (1.803 m)   Wt 201 lb (91.2 kg)   SpO2 98%   BMI 28.03 kg/m  , BMI Body mass index is 28.03 kg/m. GEN: Well nourished, well developed, in no acute distress  HEENT: normal  Neck: no JVD, carotid bruits, or masses Cardiac: RRR; no murmurs, rubs, or gallops,no edema  Respiratory:  clear to auscultation bilaterally, normal work of breathing GI: soft, nontender, nondistended, + BS MS: no deformity or atrophy  Skin: warm and dry, no rash Neuro:  Strength and sensation are intact Psych: euthymic mood, full affect Vascular: Femoral pulses are +1 bilaterally. Distal pulses are faint.   EKG:  EKG is  ordered today. The ekg ordered today demonstrates  normal sinus rhythm with no significant ST or T wave changes.   Recent Labs: No results found for requested labs within last 8760 hours.    Lipid Panel    Component Value Date/Time   CHOL 170 08/13/2014 1010   TRIG 145.0 08/13/2014 1010   HDL 30.80 (L) 08/13/2014 1010   CHOLHDL 6 08/13/2014 1010   VLDL 29.0 08/13/2014 1010   LDLCALC 110 (H) 08/13/2014 1010      Wt Readings from Last 3 Encounters:  07/20/16 201 lb (91.2 kg)  02/18/15 209 lb 12.8 oz (95.2 kg)  08/13/14 200 lb (90.7 kg)       No flowsheet data  found.    ASSESSMENT AND PLAN:  1.  Peripheral arterial disease:Recurrent severe bilateral leg claudication which is currently lifestyle limiting due to in stent restenosis in bilateral iliac arteries. Given severity of his symptoms and interference with his ability to work, I recommend proceeding with abdominal aortogram with lower extremity runoff and possible endovascular intervention. I will consider using covered stents. Start Plavix 75 mg once daily.   2. Tobacco use:I discussed with him that tobacco use was likely partially responsible for in-stent restenosis. He quit smoking recently.  3. Hyperlipidemia: He had previous mild hyperlipidemia and given his peripheral arterial disease, I have recommended in the past treatment with a statin. He declined. He is now agreeable and I elected to start atorvastatin 40 mg daily.    Disposition:   FU with me in 1 month  Signed,  [name], MD  07/20/2016 9:54 AM    [facility]

## 2016-07-20 NOTE — Patient Instructions (Addendum)
Medication Instructions:  Your physician has recommended you make the following change in your medication:  1. START Plavix (clopidogrel)  take one tablet by mouth daily 2. START Atorvastatin  take one tablet by mouth daily every evening  Labwork: Your physician recommends that you have lab work today: BMP, CBC and PT/INR  Testing/Procedures: Your physician has requested that you have a peripheral vascular angiogram. This exam is performed at the hospital. During this exam IV contrast is used to look at arterial blood flow. Please review the information sheet given for details.    [facility] [address] [place] [address] Dept: [phone] Loc: [phone]  Terrence Swanson  07/20/2016  You are scheduled for a Peripheral Angiogram on Wednesday, May 2 with Dr. [name].  1. Please arrive at the [facility] (Main Entrance A) at [facility]: [address] [place], [place] [address] at 6:30 AM (two hours before your procedure to ensure your preparation). Free valet parking service is available.   Special note: Every effort is made to have your procedure done on time. Please understand that emergencies sometimes delay scheduled procedures.  2. Diet: Do not eat or drink anything after midnight prior to your procedure except sips of water to take medications.  3. Labs: Labs will be drawn today.   4. Medication instructions in preparation for your procedure:  On the morning of your procedure, take your Aspirin and Plavix/Clopidogrel and any morning medicines NOT listed above.  You may use sips of water.  5. Plan for one night stay--bring personal belongings.  6. Bring a current list of your medications and current insurance cards.  7. You MUST have a responsible person to drive you home.  8. Someone MUST be with you the first 24 hours after you arrive home or your  discharge will be delayed.  9. Please wear clothes that are easy to get on and off and wear slip-on shoes.  Thank you for allowing [place] to care for you!   -- [facility]   Follow-Up: Your physician recommends that you schedule a follow-up appointment in: 1 MONTH with Dr Kirke Corin   Any Other Special Instructions Will Be Listed Below (If Applicable).     If you need a refill on your cardiac medications before your next appointment, please call your pharmacy.

## 2016-07-21 LAB — PROTIME-INR
INR: 1
Prothrombin Time: 10.9 s (ref 9.0–11.5)

## 2016-07-22 ENCOUNTER — Telehealth: Payer: Self-pay | Admitting: Cardiovascular Disease

## 2016-07-22 NOTE — Telephone Encounter (Signed)
New message    Pt is calling stating he has some questions about his medications he was just prescribed. He is asking to be called.

## 2016-07-22 NOTE — Telephone Encounter (Signed)
The pt called the office to clarify if he should take ASA and Plavix prior to procedure. I advised him to take both of these medications the morning of procedure before coming to the hospital. Pt verbalized understanding.

## 2016-07-28 ENCOUNTER — Ambulatory Visit (HOSPITAL_COMMUNITY)
Admission: RE | Admit: 2016-07-28 | Discharge: 2016-07-28 | Disposition: A | Discharge status: Home / Self Care | Payer: BLUE CROSS/BLUE SHIELD | Source: Ambulatory Visit | Attending: Cardiovascular Disease | Admitting: Cardiovascular Disease

## 2016-07-28 ENCOUNTER — Encounter (HOSPITAL_COMMUNITY)
Admission: RE | Disposition: A | Discharge status: Home / Self Care | Payer: Self-pay | Source: Ambulatory Visit | Attending: Cardiovascular Disease

## 2016-07-28 DIAGNOSIS — F329 Major depressive disorder, single episode, unspecified: Secondary | ICD-10-CM | POA: Diagnosis not present

## 2016-07-28 DIAGNOSIS — Z7982 Long term (current) use of aspirin: Secondary | ICD-10-CM | POA: Insufficient documentation

## 2016-07-28 DIAGNOSIS — Y831 Surgical operation with implant of artificial internal device as the cause of abnormal reaction of the patient, or of later complication, without mention of misadventure at the time of the procedure: Secondary | ICD-10-CM | POA: Insufficient documentation

## 2016-07-28 DIAGNOSIS — I70213 Atherosclerosis of native arteries of extremities with intermittent claudication, bilateral legs: Secondary | ICD-10-CM | POA: Diagnosis not present

## 2016-07-28 DIAGNOSIS — F17210 Nicotine dependence, cigarettes, uncomplicated: Secondary | ICD-10-CM | POA: Diagnosis not present

## 2016-07-28 DIAGNOSIS — I700 Atherosclerosis of aorta: Secondary | ICD-10-CM | POA: Diagnosis not present

## 2016-07-28 DIAGNOSIS — I739 Peripheral vascular disease, unspecified: Secondary | ICD-10-CM | POA: Diagnosis present

## 2016-07-28 DIAGNOSIS — E785 Hyperlipidemia, unspecified: Secondary | ICD-10-CM | POA: Insufficient documentation

## 2016-07-28 DIAGNOSIS — F419 Anxiety disorder, unspecified: Secondary | ICD-10-CM | POA: Insufficient documentation

## 2016-07-28 DIAGNOSIS — T82856A Stenosis of peripheral vascular stent, initial encounter: Secondary | ICD-10-CM | POA: Insufficient documentation

## 2016-07-28 HISTORY — PX: ABDOMINAL AORTOGRAM: CATH118222

## 2016-07-28 HISTORY — PX: PERIPHERAL VASCULAR INTERVENTION: CATH118257

## 2016-07-28 LAB — POCT ACTIVATED CLOTTING TIME
Activated Clotting Time: 263 seconds
Activated Clotting Time: 213 seconds
Activated Clotting Time: 169 seconds

## 2016-07-28 SURGERY — ABDOMINAL AORTOGRAM
Anesthesia: LOCAL

## 2016-07-28 MED ORDER — SODIUM CHLORIDE 0.9% FLUSH: 3.0000 mL | Freq: Two times a day (BID) | INTRAVENOUS | Status: DC

## 2016-07-28 MED ORDER — FENTANYL CITRATE (PF) 100 MCG/2ML IJ SOLN
INTRAMUSCULAR | Status: AC
  Filled 2016-07-28: qty 2

## 2016-07-28 MED ORDER — HEPARIN (PORCINE) IN NACL 2-0.9 UNIT/ML-% IJ SOLN
INTRAMUSCULAR | Status: AC
  Filled 2016-07-28: qty 500

## 2016-07-28 MED ORDER — SODIUM CHLORIDE 0.9% FLUSH: 3.0000 mL | INTRAVENOUS | Status: DC | PRN

## 2016-07-28 MED ORDER — HEPARIN SODIUM (PORCINE) 1000 UNIT/ML IJ SOLN
INTRAMUSCULAR | Status: AC
  Filled 2016-07-28: qty 1

## 2016-07-28 MED ORDER — FENTANYL CITRATE (PF) 100 MCG/2ML IJ SOLN
INTRAMUSCULAR | Status: DC | PRN
  Administered 2016-07-28: 25 ug via INTRAVENOUS

## 2016-07-28 MED ORDER — ASPIRIN 81 MG PO CHEW: 81.0000 mg | CHEWABLE_TABLET | ORAL | Status: DC

## 2016-07-28 MED ORDER — SODIUM CHLORIDE 0.9 % WEIGHT BASED INFUSION: 1.0000 mL/kg/h | INTRAVENOUS | Status: DC

## 2016-07-28 MED ORDER — LIDOCAINE HCL 1 % IJ SOLN
INTRAMUSCULAR | Status: AC
  Filled 2016-07-28: qty 20

## 2016-07-28 MED ORDER — IODIXANOL 320 MG/ML IV SOLN
INTRAVENOUS | Status: DC | PRN
  Administered 2016-07-28: 90 mL via INTRAVENOUS

## 2016-07-28 MED ORDER — SODIUM CHLORIDE 0.9 % IV SOLN: 250.0000 mL | INTRAVENOUS | Status: DC | PRN

## 2016-07-28 MED ORDER — HEPARIN (PORCINE) IN NACL 2-0.9 UNIT/ML-% IJ SOLN
INTRAMUSCULAR | Status: DC | PRN
  Administered 2016-07-28: 1000 mL

## 2016-07-28 MED ORDER — SODIUM CHLORIDE 0.9 % IV SOLN: INTRAVENOUS | Status: AC

## 2016-07-28 MED ORDER — LIDOCAINE HCL (PF) 1 % IJ SOLN
INTRAMUSCULAR | Status: DC | PRN
  Administered 2016-07-28: 17 mL
  Administered 2016-07-28: 18 mL

## 2016-07-28 MED ORDER — MIDAZOLAM HCL 2 MG/2ML IJ SOLN
INTRAMUSCULAR | Status: AC
  Filled 2016-07-28: qty 2

## 2016-07-28 MED ORDER — MIDAZOLAM HCL 2 MG/2ML IJ SOLN
INTRAMUSCULAR | Status: DC | PRN
  Administered 2016-07-28 (×2): 1 mg via INTRAVENOUS

## 2016-07-28 MED ORDER — SODIUM CHLORIDE 0.9 % WEIGHT BASED INFUSION
3.0000 mL/kg/h | INTRAVENOUS | Status: AC
  Administered 2016-07-28: 3 mL/kg/h via INTRAVENOUS

## 2016-07-28 MED ORDER — HEPARIN SODIUM (PORCINE) 1000 UNIT/ML IJ SOLN
INTRAMUSCULAR | Status: DC | PRN
  Administered 2016-07-28: 7000 [IU] via INTRAVENOUS

## 2016-07-28 SURGICAL SUPPLY — 22 items
BALLN MUSTANG 9X20X75 (BALLOONS) ×6
BALLOON MUSTANG 9X20X75 (BALLOONS) ×4 IMPLANT
CATH ANGIO 5F PIGTAIL 65CM (CATHETERS) ×3 IMPLANT
CATH STRAIGHT 5FR 65CM (CATHETERS) ×3 IMPLANT
DEVICE CLOSURE MYNXGRIP 6/7F (Vascular Products) ×3 IMPLANT
DEVICE CONTINUOUS FLUSH (MISCELLANEOUS) ×3 IMPLANT
KIT ENCORE 26 ADVANTAGE (KITS) ×6 IMPLANT
KIT MICROINTRODUCER STIFF 5F (SHEATH) ×3 IMPLANT
KIT PV (KITS) ×3 IMPLANT
SHEATH BRITE TIP 7FR 35CM (SHEATH) ×6 IMPLANT
SHEATH PINNACLE 5F 10CM (SHEATH) ×3 IMPLANT
SHEATH PINNACLE 7F 10CM (SHEATH) ×3 IMPLANT
STENT VIABAHN 7X29X80 VBX (Permanent Stent) ×3 IMPLANT
STENT VIABAHN 8X39X80 VBX (Permanent Stent) ×3 IMPLANT
STOPCOCK MORSE 400PSI 3WAY (MISCELLANEOUS) ×3 IMPLANT
SYRINGE MEDRAD AVANTA MACH 7 (SYRINGE) ×3 IMPLANT
TRANSDUCER W/STOPCOCK (MISCELLANEOUS) ×3 IMPLANT
TRAY PV CATH (CUSTOM PROCEDURE TRAY) ×3 IMPLANT
TUBING CIL FLEX 10 FLL-RA (TUBING) ×3 IMPLANT
WIRE HI TORQ VERSACORE J 260CM (WIRE) ×3 IMPLANT
WIRE HITORQ VERSACORE ST 145CM (WIRE) ×3 IMPLANT
WIRE VERSACORE LOC 115CM (WIRE) ×3 IMPLANT

## 2016-07-28 NOTE — H&P (View-Only) (Signed)
  Cardiology Office Note   Date:  07/20/2016   ID:  Tsuneo Franklin Hyder, DOB 02/04/1961, MRN [id number]  PCP:  SASSER,PAUL W, MD  Cardiologist:   Dana Debo, MD   No chief complaint on file.     History of Present Illness: Terrence Swanson is a 56 y.o. male who presents for a follow-up visit regarding  peripheral arterial disease. He has no previous cardiac history. He has prolonged history of tobacco use and reports smoking one and a half pack per day since he was 56 years old. He underwent kissing stent placement to the bilateral common iliac arteries extending into the distal aorta in April, 2016 of this year for severe claudication. He had recent noninvasive vascular studies done which showed mildly reduced ABI with significant in-stent restenosis in bilateral common iliac arteries. 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 This has significantly affected his ability to perform his work. The claudication happens after walking about 100 feet. He quit smoking recently. He has been under significant stress as he reports that his son is alcoholic. He denies any chest pain or shortness of breath.   Past Medical History:  Diagnosis Date  . Anxiety   . Depression     Past Surgical History:  Procedure Laterality Date  . ABDOMINAL AORTAGRAM N/A 07/24/2014   Procedure: ABDOMINAL AORTAGRAM;  Surgeon: Telicia Hodgkiss A Talib Headley, MD;  Location: [facility] C[facility];  Service: Cardiovascular;  Laterality: N/A;  . PERCUTANEOUS STENT INTERVENTION N/A 07/24/2014   Procedure: PERCUTANEOUS STENT INTERVENTION;  Surgeon: Brad Mcgaughy A Raiza Kiesel, MD;  Location: [facility] CATH LAB;  Service: Cardiovascular;  Laterality: N/A;  rt and left common iliacs     Current Outpatient Prescriptions  Medication Sig Dispense Refill  . aspirin EC 81 MG tablet Take 1 tablet (81 mg total) by mouth daily.    . clonazePAM (KLONOPIN) 1 MG tablet Take 1 mg by  mouth 3 (three) times daily as needed for anxiety.    . pantoprazole (PROTONIX) 40 MG tablet Take 40 mg by mouth daily.     No current facility-administered medications for this visit.     Allergies:   Patient has no known allergies.    Social History:  The patient  reports that he has been smoking.  He has never used smokeless tobacco. He reports that he does not drink alcohol or use drugs.   Family History:  The patient's family history includes Cancer in his father; Diabetes in his mother.    ROS:  Please see the history of present illness.   Otherwise, review of systems are positive for none.   All other systems are reviewed and negative.    PHYSICAL EXAM: VS:  BP 110/76   Pulse 77   Ht 5' 11" (1.803 m)   Wt 201 lb (91.2 kg)   SpO2 98%   BMI 28.03 kg/m  , BMI Body mass index is 28.03 kg/m. GEN: Well nourished, well developed, in no acute distress  HEENT: normal  Neck: no JVD, carotid bruits, or masses Cardiac: RRR; no murmurs, rubs, or gallops,no edema  Respiratory:  clear to auscultation bilaterally, normal work of breathing GI: soft, nontender, nondistended, + BS MS: no deformity or atrophy  Skin: warm and dry, no rash Neuro:  Strength and sensation are intact Psych: euthymic mood, full affect Vascular: Femoral pulses are +1 bilaterally. Distal pulses are faint.   EKG:  EKG is   ordered today. The ekg ordered today demonstrates  normal sinus rhythm with no significant ST or T wave changes.   Recent Labs: No results found for requested labs within last 8760 hours.    Lipid Panel    Component Value Date/Time   CHOL 170 08/13/2014 1010   TRIG 145.0 08/13/2014 1010   HDL 30.80 (L) 08/13/2014 1010   CHOLHDL 6 08/13/2014 1010   VLDL 29.0 08/13/2014 1010   LDLCALC 110 (H) 08/13/2014 1010      Wt Readings from Last 3 Encounters:  07/20/16 201 lb (91.2 kg)  02/18/15 209 lb 12.8 oz (95.2 kg)  08/13/14 200 lb (90.7 kg)       No flowsheet data  found.    ASSESSMENT AND PLAN:  1.  Peripheral arterial disease:Recurrent severe bilateral leg claudication which is currently lifestyle limiting due to in stent restenosis in bilateral iliac arteries. Given severity of his symptoms and interference with his ability to work, I recommend proceeding with abdominal aortogram with lower extremity runoff and possible endovascular intervention. I will consider using covered stents. Start Plavix 75 mg once daily.   2. Tobacco use:I discussed with him that tobacco use was likely partially responsible for in-stent restenosis. He quit smoking recently.  3. Hyperlipidemia: He had previous mild hyperlipidemia and given his peripheral arterial disease, I have recommended in the past treatment with a statin. He declined. He is now agreeable and I elected to start atorvastatin 40 mg daily.    Disposition:   FU with me in 1 month  Signed,  [name], MD  07/20/2016 9:54 AM    [facility]

## 2016-07-28 NOTE — Progress Notes (Addendum)
Site area: LFA Site Prior to Removal:  Level 0 Pressure Applied For:20 min Manual:   yes Patient Status During Pull: stable  Post Pull Site:  Level 0 Post Pull Instructions Given:  yes Post Pull Pulses Present: palpable Dressing Applied: tegaderm  Bedrest begins @ 1130 till 1730 Comments:

## 2016-07-28 NOTE — Interval H&P Note (Signed)
History and Physical Interval Note:  07/28/2016 8:26 AM  Terrence Swanson  has presented today for surgery, with the diagnosis of pad  The various methods of treatment have been discussed with the patient and family. After consideration of risks, benefits and other options for treatment, the patient has consented to  Procedure(s): Abdominal Aortogram w/Lower Extremity (N/A) as a surgical intervention .  The patient's history has been reviewed, patient examined, no change in status, stable for surgery.  I have reviewed the patient's chart and labs.  Questions were answered to the patient's satisfaction.     [name]

## 2016-07-28 NOTE — Discharge Instructions (Signed)
Femoral Site Care °Refer to this sheet in the next few weeks. These instructions provide you with information about caring for yourself after your procedure. Your health care provider may also give you more specific instructions. Your treatment has been planned according to current medical practices, but problems sometimes occur. Call your health care provider if you have any problems or questions after your procedure. °What can I expect after the procedure? °After your procedure, it is typical to have the following: °· Bruising at the site that usually fades within 1-2 weeks. °· Blood collecting in the tissue (hematoma) that may be painful to the touch. It should usually decrease in size and tenderness within 1-2 weeks. °Follow these instructions at home: °· Take medicines only as directed by your health care provider. °· You may shower 24-48 hours after the procedure or as directed by your health care provider. Remove the bandage (dressing) and gently wash the site with plain soap and water. Pat the area dry with a clean towel. Do not rub the site, because this may cause bleeding. °· Do not take baths, swim, or use a hot tub until your health care provider approves. °· Check your insertion site every day for redness, swelling, or drainage. °· Do not apply powder or lotion to the site. °· Limit use of stairs to twice a day for the first 2-3 days or as directed by your health care provider. °· Do not squat for the first 2-3 days or as directed by your health care provider. °· Do not lift over 10 lb (4.5 kg) for 5 days after your procedure or as directed by your health care provider. °· Ask your health care provider when it is okay to: °¨ Return to work or school. °¨ Resume usual physical activities or sports. °¨ Resume sexual activity. °· Do not drive home if you are discharged the same day as the procedure. Have someone else drive you. °· You may drive 24 hours after the procedure unless otherwise instructed by  your health care provider. °· Do not operate machinery or power tools for 24 hours after the procedure or as directed by your health care provider. °· If your procedure was done as an outpatient procedure, which means that you went home the same day as your procedure, a responsible adult should be with you for the first 24 hours after you arrive home. °· Keep all follow-up visits as directed by your health care provider. This is important. °Contact a health care provider if: °· You have a fever. °· You have chills. °· You have increased bleeding from the site. Hold pressure on the site. °Get help right away if: °· You have unusual pain at the site. °· You have redness, warmth, or swelling at the site. °· You have drainage (other than a small amount of blood on the dressing) from the site. °· The site is bleeding, and the bleeding does not stop after 30 minutes of holding steady pressure on the site. °· Your leg or foot becomes pale, cool, tingly, or numb. °This information is not intended to replace advice given to you by your health care provider. Make sure you discuss any questions you have with your health care provider. °Document Released: 11/16/2013 Document Revised: 08/21/2015 Document Reviewed: 10/02/2013 °[employer]vier Interactive Patient Education © 2017 [employer] ° °

## 2016-07-29 ENCOUNTER — Encounter (HOSPITAL_COMMUNITY): Payer: Self-pay | Admitting: Cardiovascular Disease

## 2016-08-17 ENCOUNTER — Ambulatory Visit: Payer: BLUE CROSS/BLUE SHIELD | Admitting: Cardiovascular Disease

## 2016-08-27 ENCOUNTER — Other Ambulatory Visit: Payer: Self-pay | Admitting: Cardiovascular Disease

## 2016-08-27 DIAGNOSIS — I700 Atherosclerosis of aorta: Secondary | ICD-10-CM

## 2016-08-31 ENCOUNTER — Telehealth: Payer: Self-pay | Admitting: Cardiology

## 2016-08-31 ENCOUNTER — Ambulatory Visit (INDEPENDENT_AMBULATORY_CARE_PROVIDER_SITE_OTHER): Payer: BLUE CROSS/BLUE SHIELD | Admitting: Cardiovascular Disease

## 2016-08-31 ENCOUNTER — Encounter: Payer: Self-pay | Admitting: Cardiovascular Disease

## 2016-08-31 ENCOUNTER — Other Ambulatory Visit: Payer: Self-pay

## 2016-08-31 VITALS — BP 100/68 | HR 76 | Ht 71.0 in | Wt 196.4 lb

## 2016-08-31 DIAGNOSIS — I739 Peripheral vascular disease, unspecified: Secondary | ICD-10-CM

## 2016-08-31 DIAGNOSIS — E785 Hyperlipidemia, unspecified: Secondary | ICD-10-CM | POA: Diagnosis not present

## 2016-08-31 LAB — LIPID PANEL
Chol/HDL Ratio: 3.6 ratio (ref 0.0–5.0)
Cholesterol, Total: 127 mg/dL (ref 100–199)
HDL: 35 mg/dL — ABNORMAL LOW (ref >39)
LDL Calculated: 72 mg/dL (ref 0–99)
Triglycerides: 102 mg/dL (ref 0–149)
VLDL Cholesterol Cal: 20 mg/dL (ref 5–40)

## 2016-08-31 LAB — HEPATIC FUNCTION PANEL
ALT: 21 IU/L (ref 0–44)
AST: 28 IU/L (ref 0–40)
Albumin: 4.8 g/dL (ref 3.5–5.5)
Alkaline Phosphatase: 153 IU/L — ABNORMAL HIGH (ref 39–117)
Bilirubin Total: 0.7 mg/dL (ref 0.0–1.2)
Bilirubin, Direct: 0.2 mg/dL (ref 0.00–0.40)
Total Protein: 7.7 g/dL (ref 6.0–8.5)

## 2016-08-31 NOTE — Progress Notes (Signed)
Cardiology Office Note   Date:  08/31/2016   ID:  Terrence Swanson, DOB 1960-11-20, MRN [id number]  PCP:  Estanislado PandySasser, Paul W, MD  Cardiologist:   Lorine BearsMuhammad Aviv Rota, MD   Chief Complaint  Patient presents with  . Follow-up      History of Present Illness: Terrence Swanson is a 56 y.o. male who presents for a follow-up visit regarding  peripheral arterial disease. He has no previous cardiac history. He has prolonged history of tobacco use . He underwent kissing stent placement to the bilateral common iliac arteries extending into the distal aorta in April, 2016 of this year for severe claudication. He developed recurrent severe claudication recently due to in-stent restenosis. I proceeded with angiography last month which confirmed this. I performed successful bilateral common iliac artery stent placement with covered stents into the distal aorta. He has been on aspirin and Plavix since then. He quit smoking. His claudication resolved completely. No other complaints.  Past Medical History:  Diagnosis Date  . Anxiety   . Depression     Past Surgical History:  Procedure Laterality Date  . ABDOMINAL AORTAGRAM N/A 07/24/2014   Procedure: ABDOMINAL Ronny FlurryAORTAGRAM;  Surgeon: Iran OuchMuhammad A Illya Gienger, MD;  Location: [facility];  Service: Cardiovascular;  Laterality: N/A;  . ABDOMINAL AORTOGRAM N/A 07/28/2016   Procedure: Abdominal Aortogram;  Surgeon: Iran OuchMuhammad A Alixandrea Milleson, MD;  Location: [facility];  Service: Cardiovascular;  Laterality: N/A;  . PERCUTANEOUS STENT INTERVENTION N/A 07/24/2014   Procedure: PERCUTANEOUS STENT INTERVENTION;  Surgeon: Iran OuchMuhammad A Eulonda Andalon, MD;  Location: [facility];  Service: Cardiovascular;  Laterality: N/A;  rt and left common iliacs  . PERIPHERAL VASCULAR INTERVENTION Bilateral 07/28/2016   Procedure: Peripheral Vascular Intervention;  Surgeon: Iran OuchMuhammad A Lameka Disla, MD;  Location: [facility];  Service: Cardiovascular;  Laterality: Bilateral;  COMMON ILIACS     Current  Outpatient Prescriptions  Medication Sig Dispense Refill  . aspirin EC 81 MG tablet Take 1 tablet (81 mg total) by mouth daily.    Marland Kitchen. atorvastatin (LIPITOR) 40 MG tablet Take 1 tablet (40 mg total) by mouth daily. 30 tablet 11  . clonazePAM (KLONOPIN) 1 MG tablet Take 1 mg by mouth 3 (three) times daily as needed for anxiety.    . clopidogrel (PLAVIX) 75 MG tablet Take 1 tablet (75 mg total) by mouth daily. 30 tablet 11  . pantoprazole (PROTONIX) 40 MG tablet Take 40 mg by mouth daily.    Marland Kitchen. terbinafine (LAMISIL) 250 MG tablet Take 250 mg by mouth daily.     No current facility-administered medications for this visit.     Allergies:   Patient has no known allergies.    Social History:  The patient  reports that he has been smoking.  He has never used smokeless tobacco. He reports that he does not drink alcohol or use drugs.   Family History:  The patient's family history includes Cancer in his father; Diabetes in his mother.    ROS:  Please see the history of present illness.   Otherwise, review of systems are positive for none.   All other systems are reviewed and negative.    PHYSICAL EXAM: VS:  BP 100/68 (BP Location: Right Arm, Patient Position: Sitting, Cuff Size: Normal)   Pulse 76   Ht 5\' 11"  (1.803 m)   Wt 196 lb 6.4 oz (89.1 kg)   BMI 27.39 kg/m  , BMI Body mass index is 27.39 kg/m. GEN: Well nourished, well  developed, in no acute distress  HEENT: normal  Neck: no JVD, carotid bruits, or masses Cardiac: RRR; no murmurs, rubs, or gallops,no edema  Respiratory:  clear to auscultation bilaterally, normal work of breathing GI: soft, nontender, nondistended, + BS MS: no deformity or atrophy  Skin: warm and dry, no rash Neuro:  Strength and sensation are intact Psych: euthymic mood, full affect Vascular: Femoral pulses are +2 bilaterally with palpable distal pulses. No groin hematoma.   EKG:  EKG is not ordered today.    Recent Labs: 07/20/2016: BUN 10; Creat 1.23;  Hemoglobin 17.2; Platelets 230; Potassium 4.5; Sodium 141    Lipid Panel    Component Value Date/Time   CHOL 170 08/13/2014 1010   TRIG 145.0 08/13/2014 1010   HDL 30.80 (L) 08/13/2014 1010   CHOLHDL 6 08/13/2014 1010   VLDL 29.0 08/13/2014 1010   LDLCALC 110 (H) 08/13/2014 1010      Wt Readings from Last 3 Encounters:  08/31/16 196 lb 6.4 oz (89.1 kg)  07/28/16 200 lb (90.7 kg)  07/20/16 201 lb (91.2 kg)       No flowsheet data found.    ASSESSMENT AND PLAN:  1.  Peripheral arterial disease: He is doing well after recent stenting of bilateral common iliac arteries for in-stent restenosis. Continue dual antiplatelet therapy for now. I scheduled him for an aortoiliac duplex an ABI.  2. Tobacco use: He quit smoking after the procedure.  3. Hyperlipidemia:  Continue atorvastatin 40 mg once daily. Check  lipid and liver profile today.  Disposition:   FU with me in 6 months  Signed,  [name], MD  08/31/2016 9:40 AM    [facility]

## 2016-08-31 NOTE — Telephone Encounter (Signed)
Pre-cert Verification for the following procedure    Vas US ABI with/wo TBI /Aortoiliac Duplex   [facility]

## 2016-08-31 NOTE — Patient Instructions (Addendum)
Medication Instructions:  Your physician recommends that you continue on your current medications as directed. Please refer to the Current Medication list given to you today.  Labwork: Your physician recommends that you have lab work today: LIPID and LIVER  Testing/Procedures: Your physician has requested that you have an aort[facility]-iliac duplex and ABI [facility]. During this test, an ultrasound is used to evaluate the aorta and iliac arteries. Do not eat after midnight the day before and avoid carbonated beverages  Follow-Up: Your physician wants you to follow-up in: 6 MONTHS with Dr Kirke CorinArida. You will receive a reminder letter in the mail two months in advance. If you don't receive a letter, please call our office to schedule the follow-up appointment.   Any Other Special Instructions Will Be Listed Below (If Applicable).     If you need a refill on your cardiac medications before your next appointment, please call your pharmacy.

## 2016-11-04 ENCOUNTER — Ambulatory Visit: Payer: BLUE CROSS/BLUE SHIELD

## 2016-11-04 DIAGNOSIS — I739 Peripheral vascular disease, unspecified: Secondary | ICD-10-CM | POA: Diagnosis not present

## 2016-11-04 DIAGNOSIS — I700 Atherosclerosis of aorta: Secondary | ICD-10-CM

## 2016-12-02 DIAGNOSIS — M25511 Pain in right shoulder: Secondary | ICD-10-CM | POA: Insufficient documentation

## 2016-12-02 DIAGNOSIS — S46819A Strain of other muscles, fascia and tendons at shoulder and upper arm level, unspecified arm, initial encounter: Secondary | ICD-10-CM | POA: Insufficient documentation

## 2017-03-01 ENCOUNTER — Ambulatory Visit (INDEPENDENT_AMBULATORY_CARE_PROVIDER_SITE_OTHER): Payer: BLUE CROSS/BLUE SHIELD | Admitting: Cardiovascular Disease

## 2017-03-01 ENCOUNTER — Encounter: Payer: Self-pay | Admitting: Cardiovascular Disease

## 2017-03-01 VITALS — BP 110/68 | HR 67 | Ht 71.0 in | Wt 200.0 lb

## 2017-03-01 DIAGNOSIS — E785 Hyperlipidemia, unspecified: Secondary | ICD-10-CM | POA: Diagnosis not present

## 2017-03-01 DIAGNOSIS — Z720 Tobacco use: Secondary | ICD-10-CM

## 2017-03-01 DIAGNOSIS — I739 Peripheral vascular disease, unspecified: Secondary | ICD-10-CM

## 2017-03-01 NOTE — Patient Instructions (Addendum)
Medication Instructions:  Your physician has recommended you make the following change in your medication:  1) STOP Plavix   Labwork: none  Testing/Procedures:  Aorto Iliac Duplex with ABI's in February of 2019 at our [facility].   Follow-Up: Your physician wants you to follow-up in: 1 Year with Dr. Kirke CorinArida. You will receive a reminder letter in the mail two months in advance. If you don't receive a letter, please call our office to schedule the follow-up appointment.   Any Other Special Instructions Will Be Listed Below (If Applicable).     If you need a refill on your cardiac medications before your next appointment, please call your pharmacy.

## 2017-03-01 NOTE — Progress Notes (Signed)
Cardiology Office Note   Date:  03/01/2017   ID:  Bonney RousselJames Franklin Kimrey, DOB Jan 16, 1961, MRN [id number]  PCP:  Estanislado PandySasser, Paul W, MD  Cardiologist:   Lorine BearsMuhammad Arida, MD   Chief Complaint  Patient presents with  . Follow-up    pt denied chest pain and SOB      History of Present Illness: Terrence Swanson is a 56 y.o. male who presents for a follow-up visit regarding  peripheral arterial disease. He has no previous cardiac history. He has prolonged history of tobacco use . He underwent kissing stent placement to the bilateral common iliac arteries extending into the distal aorta in April, 2016  for severe claudication. He developed recurrent severe claudication in 2018 due to in-stent restenosis.  He underwent successful bilateral common iliac artery covered stent placement into the distal aorta in May 2018 with no recurrent symptoms since then. He quit smoking.   Most recent vascular studies in August showed normal ABI and patent stents. He has been doing well with no chest pain, shortness of breath or claudication.  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  He complains of easy bruising with Plavix.   Past Medical History:  Diagnosis Date  . Anxiety   . Depression     Past Surgical History:  Procedure Laterality Date  . ABDOMINAL AORTAGRAM N/A 07/24/2014   Procedure: ABDOMINAL Ronny FlurryAORTAGRAM;  Surgeon: Iran OuchMuhammad A Arida, MD;  Location: [facility];  Service: Cardiovascular;  Laterality: N/A;  . ABDOMINAL AORTOGRAM N/A 07/28/2016   Procedure: Abdominal Aortogram;  Surgeon: Iran OuchMuhammad A Arida, MD;  Location: [facility];  Service: Cardiovascular;  Laterality: N/A;  . PERCUTANEOUS STENT INTERVENTION N/A 07/24/2014   Procedure: PERCUTANEOUS STENT INTERVENTION;  Surgeon: Iran OuchMuhammad A Arida, MD;  Location: [facility];  Service: Cardiovascular;  Laterality: N/A;  rt and left common iliacs  . PERIPHERAL VASCULAR INTERVENTION Bilateral  07/28/2016   Procedure: Peripheral Vascular Intervention;  Surgeon: Iran OuchMuhammad A Arida, MD;  Location: [facility];  Service: Cardiovascular;  Laterality: Bilateral;  COMMON ILIACS     Current Outpatient Medications  Medication Sig Dispense Refill  . aspirin EC 81 MG tablet Take 1 tablet (81 mg total) by mouth daily.    Marland Kitchen. atorvastatin (LIPITOR) 40 MG tablet Take 1 tablet (40 mg total) by mouth daily. 30 tablet 11  . clonazePAM (KLONOPIN) 1 MG tablet Take 1 mg by mouth 3 (three) times daily as needed for anxiety.    . clopidogrel (PLAVIX) 75 MG tablet Take 1 tablet (75 mg total) by mouth daily. 30 tablet 11  . pantoprazole (PROTONIX) 40 MG tablet Take 40 mg by mouth daily.     No current facility-administered medications for this visit.     Allergies:   Patient has no known allergies.    Social History:  The patient  reports that he has been smoking.  he has never used smokeless tobacco. He reports that he does not drink alcohol or use drugs.   Family History:  The patient's family history includes Cancer in his father; Diabetes in his mother.    ROS:  Please see the history of present illness.   Otherwise, review of systems are positive for none.   All other systems are reviewed and negative.    PHYSICAL EXAM: VS:  BP 110/68   Pulse 67   Ht 5\' 11"  (1.803 m)   Wt  200 lb (90.7 kg)   BMI 27.89 kg/m  , BMI Body mass index is 27.89 kg/m. GEN: Well nourished, well developed, in no acute distress  HEENT: normal  Neck: no JVD, carotid bruits, or masses Cardiac: RRR; no murmurs, rubs, or gallops,no edema  Respiratory:  clear to auscultation bilaterally, normal work of breathing GI: soft, nontender, nondistended, + BS MS: no deformity or atrophy  Skin: warm and dry, no rash Neuro:  Strength and sensation are intact Psych: euthymic mood, full affect Vascular: Femoral pulses are +2 bilaterally with palpable distal pulses.   EKG:  EKG is ordered today.  EKG showed normal sinus  rhythm with no significant ST or T wave changes.  Recent Labs: 07/20/2016: BUN 10; Creat 1.23; Hemoglobin 17.2; Platelets 230; Potassium 4.5; Sodium 141 08/31/2016: ALT 21    Lipid Panel    Component Value Date/Time   CHOL 127 08/31/2016 1110   TRIG 102 08/31/2016 1110   HDL 35 (L) 08/31/2016 1110   CHOLHDL 3.6 08/31/2016 1110   CHOLHDL 6 08/13/2014 1010   VLDL 29.0 08/13/2014 1010   LDLCALC 72 08/31/2016 1110      Wt Readings from Last 3 Encounters:  03/01/17 200 lb (90.7 kg)  08/31/16 196 lb 6.4 oz (89.1 kg)  07/28/16 200 lb (90.7 kg)       No flowsheet data found.    ASSESSMENT AND PLAN:  1.  Peripheral arterial disease: No recurrent claudication.  Normal femoral pulses.  He is due for a follow-up aortoiliac duplex and ABI in February of next year.  It has been more than 6 months since stent placement.  Thus, I discontinued Plavix especially with easy bruising.  2. Tobacco use: He quit smoking after the procedure with no recurrence  3. Hyperlipidemia:  Continue atorvastatin 40 mg once daily.  Most recent lipid profile showed an LDL of 72.  Disposition:   FU with me in 12 months  Signed,  Lorine BearsMuhammad Arida, MD  03/01/2017 11:05 AM    [facility]

## 2017-08-01 ENCOUNTER — Other Ambulatory Visit: Payer: Self-pay | Admitting: *Deleted

## 2017-08-01 MED ORDER — ATORVASTATIN CALCIUM 40 MG PO TABS: 40.0000 mg | ORAL_TABLET | Freq: Every day | ORAL | 6 refills | Status: DC

## 2017-08-18 ENCOUNTER — Ambulatory Visit (INDEPENDENT_AMBULATORY_CARE_PROVIDER_SITE_OTHER): Payer: BLUE CROSS/BLUE SHIELD

## 2017-08-18 DIAGNOSIS — I739 Peripheral vascular disease, unspecified: Secondary | ICD-10-CM

## 2017-08-19 ENCOUNTER — Telehealth: Payer: Self-pay | Admitting: *Deleted

## 2017-08-19 NOTE — Telephone Encounter (Signed)
-----   Message from Iran Ouch, MD sent at 08/19/2017  1:15 PM EDT ----- Normal ABI but he does have some scar tissue inside the stents.  He should follow-up with me in 1 to 2 months to evaluate his symptoms.

## 2017-08-19 NOTE — Telephone Encounter (Signed)
The patient called back stating he was concerned about the results of his aorta/iliac duplex because he felt like the test was "only done on his chest" and did not go down far enough to evaluate where his iliac artery stents were placed. He was assured that this was evaluated during the duplex but insists that Dr. Kirke Corin be aware of his concerns.

## 2017-08-19 NOTE — Telephone Encounter (Signed)
Patient made aware and verbalized his understanding.  

## 2017-08-19 NOTE — Telephone Encounter (Signed)
Patient returning call.

## 2017-08-19 NOTE — Telephone Encounter (Signed)
Ok.  We can discuss all of that during his follow-up.  Right now there is nothing urgent.

## 2017-08-19 NOTE — Telephone Encounter (Signed)
Patient made aware of results and verbalized his understanding. Appointment has been made for 6/11 with Dr. Kirke Corin.   The patient stated that he was told his blood pressure was 95 systolic when he had his duplex completed. He is concerned about how low it is especially since he has been dizzy and weak lately. He is unable to check his blood pressure at home and is not currently on any blood pressure medications. He declined an earlier appointment. He has been advised to call back if his symptoms worsen.

## 2017-09-06 ENCOUNTER — Encounter: Payer: Self-pay | Admitting: Cardiovascular Disease

## 2017-09-06 ENCOUNTER — Ambulatory Visit: Payer: BLUE CROSS/BLUE SHIELD | Admitting: Cardiovascular Disease

## 2017-09-06 VITALS — BP 95/61 | HR 65 | Ht 71.0 in | Wt 188.0 lb

## 2017-09-06 DIAGNOSIS — E785 Hyperlipidemia, unspecified: Secondary | ICD-10-CM | POA: Diagnosis not present

## 2017-09-06 DIAGNOSIS — I739 Peripheral vascular disease, unspecified: Secondary | ICD-10-CM

## 2017-09-06 DIAGNOSIS — Z720 Tobacco use: Secondary | ICD-10-CM | POA: Diagnosis not present

## 2017-09-06 NOTE — Patient Instructions (Signed)

## 2017-09-06 NOTE — Progress Notes (Signed)
Cardiology Office Note   Date:  09/06/2017   ID:  Terrence, Swanson 10-Jun-1960, MRN [id number]  PCP:  Estanislado Pandy, MD  Cardiologist:   [name], MD   Chief Complaint  Patient presents with  . Follow-up      History of Present Illness: Terrence Swanson is a 57 y.o. male who presents for a follow-up visit regarding  peripheral arterial disease. He has no previous cardiac history. He has prolonged history of tobacco use . He underwent kissing stent placement to the bilateral common iliac arteries extending into the distal aorta in April, 2016  for severe claudication. He developed recurrent severe claudication in 2018 due to in-stent restenosis.  He underwent successful bilateral common iliac artery covered stent placement into the distal aorta in May 2018 . He quit smoking after that.  He has been doing well with no recent chest pain or shortness of breath.  He denies any leg claudication.  He was able to walk for 45 minutes yesterday with no issues. He had recent vascular studies done which showed normal ABI bilaterally.  However, by duplex, there was significant stenosis in both common iliac stents worse on the left side with peak velocity slightly above 400.   Past Medical History:  Diagnosis Date  . Anxiety   . Depression     Past Surgical History:  Procedure Laterality Date  . ABDOMINAL AORTAGRAM N/A 07/24/2014   Procedure: ABDOMINAL Ronny Flurry;  Surgeon: Iran Ouch, MD;  Location: [facility];  Service: Cardiovascular;  Laterality: N/A;  . ABDOMINAL AORTOGRAM N/A 07/28/2016   Procedure: Abdominal Aortogram;  Surgeon: Iran Ouch, MD;  Location: [facility];  Service: Cardiovascular;  Laterality: N/A;  . PERCUTANEOUS STENT INTERVENTION N/A 07/24/2014   Procedure: PERCUTANEOUS STENT INTERVENTION;  Surgeon: Iran Ouch, MD;  Location: [facility];  Service: Cardiovascular;  Laterality: N/A;  rt and left common iliacs  . PERIPHERAL VASCULAR  INTERVENTION Bilateral 07/28/2016   Procedure: Peripheral Vascular Intervention;  Surgeon: Iran Ouch, MD;  [facility]: [facility];  Service: Cardiovascular;  Laterality: Bilateral;  COMMON ILIACS     Current Outpatient Medications  Medication Sig Dispense Refill  . aspirin EC 81 MG tablet Take 1 tablet (81 mg total) by mouth daily.    Marland Kitchen atorvastatin (LIPITOR) 40 MG tablet Take 1 tablet (40 mg total) by mouth daily. 30 tablet 6  . clonazePAM (KLONOPIN) 1 MG tablet Take 1 mg by mouth 3 (three) times daily as needed for anxiety.    . pantoprazole (PROTONIX) 40 MG tablet Take 40 mg by mouth daily.     No current facility-administered medications for this visit.     Allergies:   Patient has no known allergies.    Social History:  The patient  reports that he has been smoking.  He has never used smokeless tobacco. He reports that he does not drink alcohol or use drugs.   Family History:  The patient's family history includes Cancer in his father; Diabetes in his mother.    ROS:  Please see the history of present illness.   Otherwise, review of systems are positive for none.   All other systems are reviewed and negative.    PHYSICAL EXAM: VS:  BP 95/61   Pulse 65   Ht 5\' 11"  (1.803 m)   Wt 188 lb (85.3 kg)   BMI 26.22 kg/m  , BMI Body mass index is 26.22 kg/m. GEN:  Well nourished, well developed, in no acute distress  HEENT: normal  Neck: no JVD, carotid bruits, or masses Cardiac: RRR; no murmurs, rubs, or gallops,no edema  Respiratory:  clear to auscultation bilaterally, normal work of breathing GI: soft, nontender, nondistended, + BS MS: no deformity or atrophy  Skin: warm and dry, no rash Neuro:  Strength and sensation are intact Psych: euthymic mood, full affect Vascular: Femoral pulse +2 on the right side and +1 on the left side:    EKG:  EKG is  ordered today. EKG showed normal sinus rhythm with no significant ST or T wave changes.  Recent Labs: No results  found for requested labs within last 8760 hours.    Lipid Panel    Component Value Date/Time   CHOL 127 08/31/2016 1110   TRIG 102 08/31/2016 1110   HDL 35 (L) 08/31/2016 1110   CHOLHDL 3.6 08/31/2016 1110   CHOLHDL 6 08/13/2014 1010   VLDL 29.0 08/13/2014 1010   LDLCALC 72 08/31/2016 1110      Wt Readings from Last 3 Encounters:  09/06/17 188 lb (85.3 kg)  03/01/17 200 lb (90.7 kg)  08/31/16 196 lb 6.4 oz (89.1 kg)       No flowsheet data found.    ASSESSMENT AND PLAN:  1.  Peripheral arterial disease: Vascular studies showed evidence of restenosis in both sides but worse on the left common iliac artery stent.  However, his ABI and toe pressures remain normal and currently he has no claudication.  Continue close monitoring for now.  I asked him to notify us if he develops any claudication especially on the left side.  Otherwise we will plan on repeat his vascular studies in  May 2020.    2. Tobacco use: He quit smoking after the procedure with no recurrence  3. Hyperlipidemia:  Continue atorvastatin 40 mg once daily.  Most recent lipid profile showed an LDL of 72.  Disposition:   FU with me in 6 months  Signed,  Lorine BearsMuhammad Berdell Hostetler, MD  09/06/2017 10:06 AM    [facility]

## 2017-11-21 ENCOUNTER — Other Ambulatory Visit: Payer: Self-pay | Admitting: Cardiovascular Disease

## 2017-11-21 DIAGNOSIS — Z95828 Presence of other vascular implants and grafts: Secondary | ICD-10-CM

## 2018-05-16 ENCOUNTER — Encounter: Payer: Self-pay | Admitting: Cardiovascular Disease

## 2018-05-16 ENCOUNTER — Ambulatory Visit: Payer: BLUE CROSS/BLUE SHIELD | Admitting: Cardiovascular Disease

## 2018-05-16 VITALS — BP 110/74 | HR 72 | Ht 71.0 in | Wt 200.0 lb

## 2018-05-16 DIAGNOSIS — E785 Hyperlipidemia, unspecified: Secondary | ICD-10-CM

## 2018-05-16 DIAGNOSIS — I739 Peripheral vascular disease, unspecified: Secondary | ICD-10-CM

## 2018-05-16 NOTE — Patient Instructions (Signed)
Medication Instructions:  No changes If you need a refill on your cardiac medications before your next appointment, please call your pharmacy.   Lab work: None ordered If you have labs (blood work) drawn today and your tests are completely normal, you will receive your results only by: Marland Kitchen MyChart Message (if you have MyChart) OR . A paper copy in the mail If you have any lab test that is abnormal or we need to change your treatment, we will call you to review the results.  Testing/Procedures: None ordered  Follow-Up: At [facility], you and your health needs are our priority.  As part of our continuing mission to provide you with exceptional heart care, we have created designated Provider Care Teams.  These Care Teams include your primary Cardiologist (physician) and Advanced Practice Providers (APPs -  Physician Assistants and Nurse Practitioners) who all work together to provide you with the care you need, when you need it. You will need a follow up appointment in 12 months.  Please call our office 2 months in advance to schedule this appointment.  You may see Dr. Kirke Corin or one of the following Advanced Practice Providers on your designated Care Team:   Corine Shelter, PA-C Judy Pimple, [place] . Marjie Skiff, PA-C

## 2018-05-16 NOTE — Progress Notes (Signed)
Cardiology Office Note   Date:  05/16/2018   ID:  Clydie, Delamora [birth year], MRN [id number]  PCP:  Estanislado Pandy, MD  Cardiologist:   [name], MD   No chief complaint on file.     History of Present Illness: Terrence Swanson is a 58 y.o. male who presents for a follow-up visit regarding  peripheral arterial disease. He has no previous cardiac history. He has prolonged history of tobacco use . He underwent kissing stent placement to the bilateral common iliac arteries extending into the distal aorta in April, 2016  for severe claudication. He developed recurrent severe claudication in 2018 due to in-stent restenosis.  He underwent successful bilateral common iliac artery covered stent placement into the distal aorta in May 2018 . He quit smoking after that.  Vascular studies last year showed normal ABI bilaterally.  However, by duplex, there was significant stenosis in both common iliac stents worse on the left side with peak velocity slightly above 400.  He continues to deny any claudication.  No chest pain or shortness of breath.  He takes his medications regularly.   Past Medical History:  Diagnosis Date  . Anxiety   . Depression     Past Surgical History:  Procedure Laterality Date  . ABDOMINAL AORTAGRAM N/A 07/24/2014   Procedure: ABDOMINAL Ronny Flurry;  Surgeon: Iran Ouch, MD;  Location: [facility] CATH [facility];  Service: Cardiovascular;  Laterality: N/A;  . ABDOMINAL AORTOGRAM N/A 07/28/2016   Procedure: Abdominal Aortogram;  Surgeon: Iran Ouch, MD;  Location: [facility] INVASIVE CV LAB;  Service: Cardiovascular;  Laterality: N/A;  . PERCUTANEOUS STENT INTERVENTION N/A 07/24/2014   Procedure: PERCUTANEOUS STENT INTERVENTION;  Surgeon: Iran Ouch, MD;  Location: [facility] CATH [facility];  Service: Cardiovascular;  Laterality: N/A;  rt and left common iliacs  . PERIPHERAL VASCULAR INTERVENTION Bilateral 07/28/2016   Procedure: Peripheral Vascular Intervention;  Surgeon:  Iran Ouch, MD;  Location: [facility] INVASIVE CV LAB;  Service: Cardiovascular;  Laterality: Bilateral;  COMMON ILIACS     Current Outpatient Medications  Medication Sig Dispense Refill  . aspirin EC 81 MG tablet Take 1 tablet (81 mg total) by mouth daily.    Marland Kitchen atorvastatin (LIPITOR) 40 MG tablet Take 1 tablet (40 mg total) by mouth daily. 30 tablet 6  . clonazePAM (KLONOPIN) 1 MG tablet Take 1 mg by mouth 3 (three) times daily as needed for anxiety.    . pantoprazole (PROTONIX) 40 MG tablet Take 40 mg by mouth daily.     No current facility-administered medications for this visit.     Allergies:   Patient has no known allergies.    Social History:  The patient  reports that he has been smoking. He has never used smokeless tobacco. He reports that he does not drink alcohol or use drugs.   Family History:  The patient's family history includes Cancer in his father; Diabetes in his mother.    ROS:  Please see the history of present illness.   Otherwise, review of systems are positive for none.   All other systems are reviewed and negative.    PHYSICAL EXAM: VS:  BP 110/74   Pulse 72   Ht 5\' 11"  (1.803 m)   Wt 200 lb (90.7 kg)   BMI 27.89 kg/m  , BMI Body mass index is 27.89 kg/m. GEN: Well nourished, well developed, in no acute distress  HEENT: normal  Neck: no JVD, carotid bruits, or masses Cardiac: RRR;  no murmurs, rubs, or gallops,no edema  Respiratory:  clear to auscultation bilaterally, normal work of breathing GI: soft, nontender, nondistended, + BS MS: no deformity or atrophy  Skin: warm and dry, no rash Neuro:  Strength and sensation are intact Psych: euthymic mood, full affect Vascular: Femoral pulse +2 on the right side and +1 on the left side:    EKG:  EKG is  ordered today. EKG showed normal sinus rhythm with no significant ST or T wave changes.  Recent Labs: No results found for requested labs within last 8760 hours.    Lipid Panel    Component Value  Date/Time   CHOL 127 08/31/2016 1110   TRIG 102 08/31/2016 1110   HDL 35 (L) 08/31/2016 1110   CHOLHDL 3.6 08/31/2016 1110   CHOLHDL 6 08/13/2014 1010   VLDL 29.0 08/13/2014 1010   LDLCALC 72 08/31/2016 1110      Wt Readings from Last 3 Encounters:  05/16/18 200 lb (90.7 kg)  09/06/17 188 lb (85.3 kg)  03/01/17 200 lb (90.7 kg)       No flowsheet data found.    ASSESSMENT AND PLAN:  1.  Peripheral arterial disease: No recurrent claudication in his pulses are unchanged from last year.  He does have evidence of restenosis by duplex but his ABI and toe pressure has remained normal with no evidence of claudication.   Repeat vascular studies in  May 2020.    2. Previous Tobacco use: He reports no smoking since 2018  3. Hyperlipidemia:  Continue atorvastatin 40 mg once daily.  Most recent lipid profile showed an LDL of 72.   Disposition:   FU with me in 12 months  Signed,   [name], MD  05/16/2018 10:00 AM    [facility]

## 2018-07-20 ENCOUNTER — Other Ambulatory Visit: Payer: Self-pay | Admitting: Cardiovascular Disease

## 2018-07-20 MED ORDER — ATORVASTATIN CALCIUM 40 MG PO TABS: 40.0000 mg | ORAL_TABLET | Freq: Every day | ORAL | 10 refills | Status: DC

## 2018-08-02 ENCOUNTER — Other Ambulatory Visit: Payer: Self-pay | Admitting: Cardiovascular Disease

## 2018-08-02 DIAGNOSIS — I739 Peripheral vascular disease, unspecified: Secondary | ICD-10-CM

## 2018-08-09 ENCOUNTER — Other Ambulatory Visit: Payer: Self-pay | Admitting: Cardiovascular Disease

## 2018-08-09 DIAGNOSIS — I739 Peripheral vascular disease, unspecified: Secondary | ICD-10-CM

## 2018-08-15 ENCOUNTER — Telehealth: Payer: Self-pay | Admitting: Cardiovascular Disease

## 2018-08-15 NOTE — Telephone Encounter (Signed)
° °  COVID-19 Pre-Screening Questions: ° °• Do you currently have a fever?NO ° ° °• Have you recently travelled on a cruise, internationally, or to [place], [place], [place], [place], [place], or [place], [place] ([place]) ? NO °•  °• Have you been in contact with someone that is currently pending confirmation of Covid19 testing or has been confirmed to have the Covid19 virus?  NO °•  °Are you currently experiencing fatigue or cough? NO ° ° °   ° ° ° ° °

## 2018-08-16 ENCOUNTER — Other Ambulatory Visit: Payer: Self-pay

## 2018-08-16 ENCOUNTER — Ambulatory Visit (INDEPENDENT_AMBULATORY_CARE_PROVIDER_SITE_OTHER): Payer: BLUE CROSS/BLUE SHIELD

## 2018-08-16 ENCOUNTER — Other Ambulatory Visit: Payer: Self-pay | Admitting: Cardiovascular Disease

## 2018-08-16 DIAGNOSIS — I739 Peripheral vascular disease, unspecified: Secondary | ICD-10-CM | POA: Diagnosis not present

## 2018-08-16 DIAGNOSIS — Z95820 Peripheral vascular angioplasty status with implants and grafts: Secondary | ICD-10-CM

## 2018-08-18 ENCOUNTER — Telehealth: Payer: Self-pay | Admitting: *Deleted

## 2018-08-18 DIAGNOSIS — I739 Peripheral vascular disease, unspecified: Secondary | ICD-10-CM

## 2018-08-18 NOTE — Telephone Encounter (Signed)
Patient made aware of results and verbalized understanding. Repeat orders placed.    

## 2018-08-18 NOTE — Telephone Encounter (Signed)
-----   Message from Iran Ouch, MD sent at 08/18/2018 12:39 PM EDT ----- Normal ABI with patent iliac stents and stable scarring.  Repeat studies in 1 year.

## 2019-06-12 ENCOUNTER — Ambulatory Visit (INDEPENDENT_AMBULATORY_CARE_PROVIDER_SITE_OTHER): Payer: 59 | Admitting: Cardiovascular Disease

## 2019-06-12 ENCOUNTER — Encounter: Payer: Self-pay | Admitting: Cardiovascular Disease

## 2019-06-12 ENCOUNTER — Other Ambulatory Visit: Payer: Self-pay

## 2019-06-12 VITALS — BP 108/62 | HR 72 | Resp 15 | Ht 71.0 in | Wt 190.4 lb

## 2019-06-12 DIAGNOSIS — R072 Precordial pain: Secondary | ICD-10-CM

## 2019-06-12 DIAGNOSIS — I739 Peripheral vascular disease, unspecified: Secondary | ICD-10-CM | POA: Diagnosis not present

## 2019-06-12 DIAGNOSIS — Z720 Tobacco use: Secondary | ICD-10-CM

## 2019-06-12 DIAGNOSIS — E785 Hyperlipidemia, unspecified: Secondary | ICD-10-CM | POA: Diagnosis not present

## 2019-06-12 MED ORDER — METOPROLOL TARTRATE 100 MG PO TABS: ORAL_TABLET | ORAL | 0 refills | Status: DC

## 2019-06-12 NOTE — Patient Instructions (Signed)
Medication Instructions:  No changes *If you need a refill on your cardiac medications before your next appointment, please call your pharmacy*   Lab Work: Your provider would like for you to return within one week of the cardiac ct to have the following labs drawn: BMET. You do not need an appointment for the lab. Once in our office lobby there is a podium where you can sign in and ring the doorbell to alert [place] that you are here. The lab is open from 8:00 am to 4:30 pm; closed for lunch from 12:45pm-1:45pm.  If you have labs (blood work) drawn today and your tests are completely normal, you will receive your results only by: Marland Kitchen MyChart Message (if you have MyChart) OR . A paper copy in the mail If you have any lab test that is abnormal or we need to change your treatment, we will call you to review the results.   Testing/Procedures: Your physician has requested that you have an Aorta/Iliac Duplex in 6 months.     No food after 11PM the night before.  Water is OK. (Don't drink liquids if you have been instructed not to for ANOTHER test).  Take two Extra-Strength Gas-X capsules at bedtime the night before test.   Take an additional two Extra-Strength Gas-X capsules three (3) hours before the test or first thing in the morning.    Avoid foods that produce bowel gas, for 24 hours prior to exam (see below).    No breakfast, no chewing gum, no smoking or carbonated beverages.  Patient may take morning medications with water.  Come in for test at least 15 minutes early to register.  Your physician has requested that you have an ankle brachial index (ABI) in 6 motnhs. During this test an ultrasound and blood pressure cuff are used to evaluate the arteries that supply the arms and legs with blood. Allow thirty minutes for this exam. There are no restrictions or special instructions.   Follow-Up: At [facility], you and your health needs are our priority.  As part of our continuing  mission to provide you with exceptional heart care, we have created designated Provider Care Teams.  These Care Teams include your primary Cardiologist (physician) and Advanced Practice Providers (APPs -  Physician Assistants and Nurse Practitioners) who all work together to provide you with the care you need, when you need it.  We recommend signing up for the patient portal called "MyChart".  Sign up information is provided on this After Visit Summary.  MyChart is used to connect with patients for Virtual Visits (Telemedicine).  Patients are able to view lab/test results, encounter notes, upcoming appointments, etc.  Non-urgent messages can be sent to your provider as well.   To learn more about what you can do with MyChart, go to [email or web address].    Your next appointment:   6 month(s)  The format for your next appointment:   In Person  Provider:   [name], MD   Other Instructions Your cardiac CT will be scheduled at one of the below locations:   [facility] [address] [place], [place] [address] [phone]  OR  [facility] [address] [place], [place] [address] [phone]  If scheduled at [facility], please arrive at the [facility] main entrance of [facility] 30 minutes prior to test start time. Proceed to the [facility] Radiology Department (first floor) to check-in and test prep.  If scheduled at [facility], please arrive 15 mins early for check-in and test prep.  Please follow these instructions carefully (unless otherwise directed):  Hold all erectile dysfunction medications at least 3 days (72 hrs) prior to test.  On the Night Before the Test: . Be sure to Drink plenty of water. . Do not consume any caffeinated/decaffeinated beverages or chocolate 12 hours prior to your test. . Do not take any antihistamines 12 hours prior to your  test.   On the Day of the Test: . Drink plenty of water. Do not drink any water within one hour of the test. . Do not eat any food 4 hours prior to the test. . You may take your regular medications prior to the test.  . Take metoprolol (Lopressor) two hours prior to test       After the Test: . Drink plenty of water. . After receiving IV contrast, you may experience a mild flushed feeling. This is normal. . On occasion, you may experience a mild rash up to 24 hours after the test. This is not dangerous. If this occurs, you can take Benadryl 25 mg and increase your fluid intake. . If you experience trouble breathing, this can be serious. If it is severe call 911 IMMEDIATELY. If it is mild, please call our office. . If you take any of these medications: Glipizide/Metformin, Avandament, Glucavance, please do not take 48 hours after completing test unless otherwise instructed.   Once we have confirmed authorization from your insurance company, we will call you to set up a date and time for your test.   For non-scheduling related questions, please contact the cardiac imaging nurse navigator should you have any questions/concerns: Marchia Bond, RN Navigator Cardiac Imaging [facility] [phone] office  For scheduling needs, including cancellations and rescheduling, please call [phone].

## 2019-06-12 NOTE — Progress Notes (Signed)
Cardiology Office Note   Date:  06/12/2019   ID:  Terrence Swanson, Terrence Swanson Oct 09, 1960, MRN [id number]  PCP:  Manon Hilding, MD  Cardiologist:   Kathlyn Sacramento, MD   No chief complaint on file.     History of Present Illness: Terrence Swanson is a 59 y.o. male who presents for a follow-up visit regarding  peripheral arterial disease but also referred by Dr. Quintin Alto to evaluate new complaints of chest pain. He has no previous cardiac history. He has prolonged history of tobacco use . He underwent kissing stent placement to the bilateral common iliac arteries extending into the distal aorta in April, 2016  for severe claudication. He developed recurrent severe claudication in 2018 due to in-stent restenosis.  He underwent successful bilateral common iliac artery covered stent placement into the distal aorta in May 2018 . He quit smoking after that.  His vascular studies did show some scarring and elevated velocities within the stents but ABI remained normal.  Recently, he started having left-sided chest pain which is localized with no radiation.  It is described as dull aching sensation that happens mostly later in the day and evenings.  It can happen at rest or with exertion and does not seem to worsen with physical activities.  Its not related to food and seems to be different from his GERD symptoms.  He feels more fatigued with some shortness of breath. He denies leg claudication but his legs feel weak.  He does admit to intermittent smoking.   Past Medical History:  Diagnosis Date  . Anxiety   . Depression     Past Surgical History:  Procedure Laterality Date  . ABDOMINAL AORTAGRAM N/A 07/24/2014   Procedure: ABDOMINAL Maxcine Ham;  Surgeon: Wellington Hampshire, MD;  Location: [place] CATH LAB;  Service: Cardiovascular;  Laterality: N/A;  . ABDOMINAL AORTOGRAM N/A 07/28/2016   Procedure: Abdominal Aortogram;  Surgeon: Wellington Hampshire, MD;  Location: [facility] CV LAB;  Service:  Cardiovascular;  Laterality: N/A;  . PERCUTANEOUS STENT INTERVENTION N/A 07/24/2014   Procedure: PERCUTANEOUS STENT INTERVENTION;  Surgeon: Wellington Hampshire, MD;  Location: [place] CATH LAB;  Service: Cardiovascular;  Laterality: N/A;  rt and left common iliacs  . PERIPHERAL VASCULAR INTERVENTION Bilateral 07/28/2016   Procedure: Peripheral Vascular Intervention;  Surgeon: Wellington Hampshire, MD;  Location: [facility] CV LAB;  Service: Cardiovascular;  Laterality: Bilateral;  COMMON ILIACS     Current Outpatient Medications  Medication Sig Dispense Refill  . aspirin EC 81 MG tablet Take 1 tablet (81 mg total) by mouth daily.    Marland Kitchen atorvastatin (LIPITOR) 40 MG tablet Take 1 tablet (40 mg total) by mouth daily. 30 tablet 10  . buPROPion (WELLBUTRIN SR) 150 MG 12 hr tablet Take 1 tablet by mouth in the morning and at bedtime.    . clonazePAM (KLONOPIN) 1 MG tablet Take 1 mg by mouth 3 (three) times daily as needed for anxiety.    . dicyclomine (BENTYL) 10 MG capsule Take 10 mg by mouth 3 (three) times daily.    . nitroGLYCERIN (NITROSTAT) 0.4 MG SL tablet Place 0.4 mg under the tongue as directed.    . pantoprazole (PROTONIX) 40 MG tablet Take 40 mg by mouth daily.     No current facility-administered medications for this visit.    Allergies:   Patient has no known allergies.    Social History:  The patient  reports that he has been smoking. He has never used  smokeless tobacco. He reports that he does not drink alcohol or use drugs.   Family History:  The patient's family history includes Cancer in his father; Diabetes in his mother.    ROS:  Please see the history of present illness.   Otherwise, review of systems are positive for none.   All other systems are reviewed and negative.    PHYSICAL EXAM: VS:  BP 108/62   Pulse 72   Resp 15   Ht 5\' 11"  (1.803 m)   Wt 190 lb 6.4 oz (86.4 kg)   SpO2 97%   BMI 26.56 kg/m  , BMI Body mass index is 26.56 kg/m. GEN: Well nourished, well developed,  in no acute distress  HEENT: normal  Neck: no JVD, carotid bruits, or masses Cardiac: RRR; no murmurs, rubs, or gallops,no edema  Respiratory:  clear to auscultation bilaterally, normal work of breathing GI: soft, nontender, nondistended, + BS MS: no deformity or atrophy  Skin: warm and dry, no rash Neuro:  Strength and sensation are intact Psych: euthymic mood, full affect Vascular: Femoral pulse is palpable bilaterally but slightly diminished   EKG:  EKG is  Not ordered today. I reviewed recent EKG done with Dr. which showed normal sinus rhythm with no significant ST or T wave changes.  There was incomplete right bundle branch block.  Recent Labs: No results found for requested labs within last 8760 hours.    Lipid Panel    Component Value Date/Time   CHOL 127 08/31/2016 1110   TRIG 102 08/31/2016 1110   HDL 35 (L) 08/31/2016 1110   CHOLHDL 3.6 08/31/2016 1110   CHOLHDL 6 08/13/2014 1010   VLDL 29.0 08/13/2014 1010   LDLCALC 72 08/31/2016 1110      Wt Readings from Last 3 Encounters:  06/12/19 190 lb 6.4 oz (86.4 kg)  05/16/18 200 lb (90.7 kg)  09/06/17 188 lb (85.3 kg)       No flowsheet data found.    ASSESSMENT AND PLAN:  1.  Chest pain: Somewhat atypical but has persisted for about a month and he has multiple risk factors for coronary artery disease.  I recommend evaluation with CTA of the coronary arteries with FFR.  2. Peripheral arterial disease: No recurrent claudication in his pulses are unchanged from last year.  He does have evidence of restenosis by duplex but his ABI and toe pressure has remained normal with no evidence of claudication.   I requested repeat vascular studies to be done in May.  3. Previous Tobacco use: He does admit to intermittent smoking.  Discussed the importance of complete cessation.  4. Hyperlipidemia: He is concerned about atorvastatin causing confusion and memory loss.  Given his risk profile, I explained to him that  the benefits outweigh risks .  He should continue the medication.   Disposition:   FU with me in 6 months  Signed,   June, MD  06/12/2019 11:45 AM    [facility]

## 2019-07-10 ENCOUNTER — Telehealth: Payer: Self-pay | Admitting: *Deleted

## 2019-07-10 DIAGNOSIS — R072 Precordial pain: Secondary | ICD-10-CM

## 2019-07-10 NOTE — Telephone Encounter (Signed)
The patient has been made aware that the cardiac ct has been denied and the a Lexiscan Myoview has been ordered instead. He has agreed to it. Message has been sent to scheduling.   Your physician has requested that you have a lexiscan myoview. For further information please visit [email or web address] Please follow instruction sheet, as given. This will take place at [address] [facility], [address]  How to prepare for your Myocardial Perfusion Test:  Do not eat or drink 3 hours prior to your test, except you may have water.  Do not consume products containing caffeine (regular or decaffeinated) 12 hours prior to your test. (ex: coffee, chocolate, sodas, tea).  Do bring a list of your current medications with you.  If not listed below, you may take your medications as normal.  Do wear comfortable clothes (no dresses or overalls) and walking shoes, tennis shoes preferred (No heels or open toe shoes are allowed).  Do NOT wear cologne, perfume, aftershave, or lotions (deodorant is allowed).  The test will take approximately 3 to 4 hours to complete  If these instructions are not followed, your test will have to be rescheduled.

## 2019-07-13 ENCOUNTER — Encounter (HOSPITAL_COMMUNITY): Payer: 59

## 2019-07-17 ENCOUNTER — Telehealth (HOSPITAL_COMMUNITY): Payer: Self-pay

## 2019-07-17 NOTE — Telephone Encounter (Signed)
Encounter complete. 

## 2019-07-19 ENCOUNTER — Other Ambulatory Visit: Payer: Self-pay

## 2019-07-19 ENCOUNTER — Ambulatory Visit (HOSPITAL_COMMUNITY)
Admission: RE | Admit: 2019-07-19 | Discharge: 2019-07-19 | Disposition: A | Discharge status: Home / Self Care | Payer: 59 | Source: Ambulatory Visit | Attending: Cardiology | Admitting: Cardiology

## 2019-07-19 DIAGNOSIS — R072 Precordial pain: Secondary | ICD-10-CM | POA: Insufficient documentation

## 2019-07-19 LAB — MYOCARDIAL PERFUSION IMAGING
LV dias vol: 122 mL (ref 62–150)
LV sys vol: 61 mL
Peak HR: 92 {beats}/min
Rest HR: 55 {beats}/min
SDS: 3
SRS: 6
SSS: 9
TID: 1.15

## 2019-07-19 MED ORDER — TECHNETIUM TC 99M TETROFOSMIN IV KIT
10.9000 | PACK | Freq: Once | INTRAVENOUS | Status: AC | PRN
  Administered 2019-07-19: 10.9 via INTRAVENOUS
  Filled 2019-07-19: qty 11

## 2019-07-19 MED ORDER — REGADENOSON 0.4 MG/5ML IV SOLN
0.4000 mg | Freq: Once | INTRAVENOUS | Status: AC
  Administered 2019-07-19: 0.4 mg via INTRAVENOUS

## 2019-07-19 MED ORDER — TECHNETIUM TC 99M TETROFOSMIN IV KIT
31.8000 | PACK | Freq: Once | INTRAVENOUS | Status: AC | PRN
  Administered 2019-07-19: 31.8 via INTRAVENOUS
  Filled 2019-07-19: qty 32

## 2019-07-20 ENCOUNTER — Telehealth: Payer: Self-pay | Admitting: *Deleted

## 2019-07-20 DIAGNOSIS — R072 Precordial pain: Secondary | ICD-10-CM

## 2019-07-20 DIAGNOSIS — Z01818 Encounter for other preprocedural examination: Secondary | ICD-10-CM

## 2019-07-20 DIAGNOSIS — I739 Peripheral vascular disease, unspecified: Secondary | ICD-10-CM

## 2019-07-20 NOTE — Telephone Encounter (Signed)
Left a message for the patient to call back.  

## 2019-07-20 NOTE — Telephone Encounter (Signed)
-----   Message from Iran Ouch, MD sent at 07/20/2019 10:24 AM EDT ----- Inform patient that  stress test was abnormal and suggestive of a prior myocardial infarction.  I recommend proceeding with left heart catheterization and possible percutaneous coronary intervention. I can do the procedure at [facility] on Friday, April 30.

## 2019-07-23 NOTE — Telephone Encounter (Signed)
The patient has been called and made aware of results and cath instructions:     [facility] CARDIOVASCULAR DIVISION [facility] [address] [place] [address] Dept: [phone] Loc: [phone]  Terrence Swanson  07/23/2019  You are scheduled for a Cardiac Catheterization on Friday, April 30 with Dr. [name].  1. Please arrive at the [facility] (Main Entrance A) at [facility]: [address] [place], [place] [address] at 8:30 AM (This time is two hours before your procedure to ensure your preparation). Free valet parking service is available.   Special note: Every effort is made to have your procedure done on time. Please understand that emergencies sometimes delay scheduled procedures.  2. Diet: Do not eat solid foods after midnight.  The patient may have clear liquids until 5am upon the day of the procedure.  3. Labs: You will need to have blood drawn on 07/24/19 at the  [facility] across from [facility].   You will need to have the coronavirus test completed prior to your procedure. An appointment has been made at 4:05 pm on 07/24/19. This is a Drive Up Visit at [facility] Someone will direct you to the appropriate testing line. Please tell them that you are there for procedure testing. Stay in your car and someone will be with you shortly. Please make sure to have all other labs completed before this test because you will need to stay quarantined until your procedure.  4. Medication instructions in preparation for your procedure: Nothing to hold  On the morning of your procedure, take your Aspirin and any morning medicines NOT listed above.  You may use sips of water.  5. Plan for one night stay--bring personal belongings. 6. Bring a current list of your medications and current insurance cards. 7. You MUST have a responsible person to drive you home. 8. Someone MUST be with you the first 24  hours after you arrive home or your discharge will be delayed. 9. Please wear clothes that are easy to get on and off and wear slip-on shoes.  Thank you for allowing [place] to care for you!   -- [facility]

## 2019-07-23 NOTE — Telephone Encounter (Signed)
Follow up  Pt is calling back to returning call from [facility] regarding stress test result  Please call

## 2019-07-24 ENCOUNTER — Other Ambulatory Visit: Payer: Self-pay

## 2019-07-24 ENCOUNTER — Other Ambulatory Visit (HOSPITAL_COMMUNITY)
Admission: RE | Admit: 2019-07-24 | Discharge: 2019-07-24 | Disposition: A | Discharge status: Home / Self Care | Payer: 59 | Source: Ambulatory Visit | Attending: Cardiovascular Disease | Admitting: Cardiovascular Disease

## 2019-07-24 DIAGNOSIS — Z20822 Contact with and (suspected) exposure to covid-19: Secondary | ICD-10-CM | POA: Diagnosis not present

## 2019-07-24 DIAGNOSIS — Z01812 Encounter for preprocedural laboratory examination: Secondary | ICD-10-CM | POA: Diagnosis present

## 2019-07-25 LAB — SARS CORONAVIRUS 2 (TAT 6-24 HRS): SARS Coronavirus 2: NEGATIVE

## 2019-07-25 LAB — BASIC METABOLIC PANEL
BUN/Creatinine Ratio: 11 (ref 10–24)
BUN: 13 mg/dL (ref 8–27)
CO2: 22 mmol/L (ref 20–29)
Calcium: 9.8 mg/dL (ref 8.6–10.2)
Chloride: 104 mmol/L (ref 96–106)
Creatinine, Ser: 1.23 mg/dL (ref 0.76–1.27)
GFR calc Af Amer: 73 mL/min/{1.73_m2} (ref >59)
GFR calc non Af Amer: 63 mL/min/{1.73_m2} (ref >59)
Glucose: 103 mg/dL — ABNORMAL HIGH (ref 65–99)
Potassium: 4.1 mmol/L (ref 3.5–5.2)
Sodium: 140 mmol/L (ref 134–144)

## 2019-07-25 LAB — CBC
Hematocrit: 51.2 % — ABNORMAL HIGH (ref 37.5–51.0)
Hemoglobin: 17.3 g/dL (ref 13.0–17.7)
MCH: 30 pg (ref 26.6–33.0)
MCHC: 33.8 g/dL (ref 31.5–35.7)
MCV: 89 fL (ref 79–97)
Platelets: 273 10*3/uL (ref 150–450)
RBC: 5.76 x10E6/uL (ref 4.14–5.80)
RDW: 12.9 % (ref 11.6–15.4)
WBC: 10.6 10*3/uL (ref 3.4–10.8)

## 2019-07-27 ENCOUNTER — Other Ambulatory Visit: Payer: Self-pay

## 2019-07-27 ENCOUNTER — Ambulatory Visit (HOSPITAL_COMMUNITY)
Admission: RE | Admit: 2019-07-27 | Discharge: 2019-07-27 | Disposition: A | Discharge status: Home / Self Care | Payer: 59 | Attending: Cardiovascular Disease | Admitting: Cardiovascular Disease

## 2019-07-27 ENCOUNTER — Encounter (HOSPITAL_COMMUNITY)
Admission: RE | Disposition: A | Discharge status: Home / Self Care | Payer: Self-pay | Source: Home / Self Care | Attending: Cardiovascular Disease

## 2019-07-27 DIAGNOSIS — E785 Hyperlipidemia, unspecified: Secondary | ICD-10-CM | POA: Insufficient documentation

## 2019-07-27 DIAGNOSIS — R072 Precordial pain: Secondary | ICD-10-CM | POA: Diagnosis not present

## 2019-07-27 DIAGNOSIS — I739 Peripheral vascular disease, unspecified: Secondary | ICD-10-CM | POA: Insufficient documentation

## 2019-07-27 DIAGNOSIS — Z7982 Long term (current) use of aspirin: Secondary | ICD-10-CM | POA: Insufficient documentation

## 2019-07-27 DIAGNOSIS — Z79899 Other long term (current) drug therapy: Secondary | ICD-10-CM | POA: Insufficient documentation

## 2019-07-27 DIAGNOSIS — I4510 Unspecified right bundle-branch block: Secondary | ICD-10-CM | POA: Insufficient documentation

## 2019-07-27 DIAGNOSIS — F419 Anxiety disorder, unspecified: Secondary | ICD-10-CM | POA: Insufficient documentation

## 2019-07-27 DIAGNOSIS — R079 Chest pain, unspecified: Secondary | ICD-10-CM

## 2019-07-27 DIAGNOSIS — F17200 Nicotine dependence, unspecified, uncomplicated: Secondary | ICD-10-CM | POA: Diagnosis not present

## 2019-07-27 DIAGNOSIS — R9439 Abnormal result of other cardiovascular function study: Secondary | ICD-10-CM | POA: Insufficient documentation

## 2019-07-27 DIAGNOSIS — F329 Major depressive disorder, single episode, unspecified: Secondary | ICD-10-CM | POA: Insufficient documentation

## 2019-07-27 HISTORY — PX: LEFT HEART CATH AND CORONARY ANGIOGRAPHY: CATH118249

## 2019-07-27 SURGERY — LEFT HEART CATH AND CORONARY ANGIOGRAPHY
Anesthesia: LOCAL

## 2019-07-27 MED ORDER — VERAPAMIL HCL 2.5 MG/ML IV SOLN
INTRAVENOUS | Status: AC
  Filled 2019-07-27: qty 2

## 2019-07-27 MED ORDER — VERAPAMIL HCL 2.5 MG/ML IV SOLN
INTRAVENOUS | Status: DC | PRN
  Administered 2019-07-27: 12:00:00 10 mL via INTRA_ARTERIAL

## 2019-07-27 MED ORDER — LIDOCAINE HCL (PF) 1 % IJ SOLN
INTRAMUSCULAR | Status: AC
  Filled 2019-07-27: qty 30

## 2019-07-27 MED ORDER — SODIUM CHLORIDE 0.9% FLUSH: 3.0000 mL | Freq: Two times a day (BID) | INTRAVENOUS | Status: DC

## 2019-07-27 MED ORDER — HEPARIN (PORCINE) IN NACL 1000-0.9 UT/500ML-% IV SOLN
INTRAVENOUS | Status: AC
  Filled 2019-07-27: qty 1000

## 2019-07-27 MED ORDER — ASPIRIN 81 MG PO CHEW
81.0000 mg | CHEWABLE_TABLET | ORAL | Status: AC
  Administered 2019-07-27: 81 mg via ORAL
  Filled 2019-07-27: qty 1

## 2019-07-27 MED ORDER — SODIUM CHLORIDE 0.9 % IV SOLN: 250.0000 mL | INTRAVENOUS | Status: DC | PRN

## 2019-07-27 MED ORDER — HEPARIN SODIUM (PORCINE) 1000 UNIT/ML IJ SOLN
INTRAMUSCULAR | Status: AC
  Filled 2019-07-27: qty 1

## 2019-07-27 MED ORDER — SODIUM CHLORIDE 0.9 % WEIGHT BASED INFUSION: 1.0000 mL/kg/h | INTRAVENOUS | Status: DC

## 2019-07-27 MED ORDER — SODIUM CHLORIDE 0.9 % IV SOLN: INTRAVENOUS | Status: DC

## 2019-07-27 MED ORDER — IOHEXOL 350 MG/ML SOLN
INTRAVENOUS | Status: DC | PRN
  Administered 2019-07-27: 50 mL

## 2019-07-27 MED ORDER — LIDOCAINE HCL (PF) 1 % IJ SOLN
INTRAMUSCULAR | Status: DC | PRN
  Administered 2019-07-27: 2 mL

## 2019-07-27 MED ORDER — FENTANYL CITRATE (PF) 100 MCG/2ML IJ SOLN
INTRAMUSCULAR | Status: DC | PRN
  Administered 2019-07-27: 50 ug via INTRAVENOUS

## 2019-07-27 MED ORDER — MIDAZOLAM HCL 2 MG/2ML IJ SOLN
INTRAMUSCULAR | Status: AC
  Filled 2019-07-27: qty 2

## 2019-07-27 MED ORDER — SODIUM CHLORIDE 0.9% FLUSH: 3.0000 mL | INTRAVENOUS | Status: DC | PRN

## 2019-07-27 MED ORDER — ONDANSETRON HCL 4 MG/2ML IJ SOLN: 4.0000 mg | Freq: Four times a day (QID) | INTRAMUSCULAR | Status: DC | PRN

## 2019-07-27 MED ORDER — HEPARIN SODIUM (PORCINE) 1000 UNIT/ML IJ SOLN
INTRAMUSCULAR | Status: DC | PRN
  Administered 2019-07-27: 4000 [IU] via INTRAVENOUS

## 2019-07-27 MED ORDER — MIDAZOLAM HCL 2 MG/2ML IJ SOLN
INTRAMUSCULAR | Status: DC | PRN
  Administered 2019-07-27: 1 mg via INTRAVENOUS

## 2019-07-27 MED ORDER — HEPARIN (PORCINE) IN NACL 1000-0.9 UT/500ML-% IV SOLN
INTRAVENOUS | Status: DC | PRN
  Administered 2019-07-27 (×2): 500 mL

## 2019-07-27 MED ORDER — ACETAMINOPHEN 325 MG PO TABS: 650.0000 mg | ORAL_TABLET | ORAL | Status: DC | PRN

## 2019-07-27 MED ORDER — SODIUM CHLORIDE 0.9 % WEIGHT BASED INFUSION
3.0000 mL/kg/h | INTRAVENOUS | Status: AC
  Administered 2019-07-27: 3 mL/kg/h via INTRAVENOUS

## 2019-07-27 MED ORDER — FENTANYL CITRATE (PF) 100 MCG/2ML IJ SOLN
INTRAMUSCULAR | Status: AC
  Filled 2019-07-27: qty 2

## 2019-07-27 SURGICAL SUPPLY — 9 items
CATH INFINITI 5FR JK (CATHETERS) ×2 IMPLANT
DEVICE RAD COMP TR BAND LRG (VASCULAR PRODUCTS) ×2 IMPLANT
GLIDESHEATH SLEND SS 6F .021 (SHEATH) ×2 IMPLANT
GUIDEWIRE INQWIRE 1.5J.035X260 (WIRE) ×1 IMPLANT
INQWIRE 1.5J .035X260CM (WIRE) ×2
KIT HEART LEFT (KITS) ×2 IMPLANT
PACK CARDIAC CATHETERIZATION (CUSTOM PROCEDURE TRAY) ×2 IMPLANT
TRANSDUCER W/STOPCOCK (MISCELLANEOUS) ×2 IMPLANT
TUBING CIL FLEX 10 FLL-RA (TUBING) ×2 IMPLANT

## 2019-07-27 NOTE — Discharge Instructions (Signed)
Radial Site Care  This sheet gives you information about how to care for yourself after your procedure. Your health care provider may also give you more specific instructions. If you have problems or questions, contact your health care provider. What can I expect after the procedure? After the procedure, it is common to have:  Bruising and tenderness at the catheter insertion area. Follow these instructions at home: Medicines  Take over-the-counter and prescription medicines only as told by your health care provider. Insertion site care  Follow instructions from your health care provider about how to take care of your insertion site. Make sure you: ? Wash your hands with soap and water before you change your bandage (dressing). If soap and water are not available, use hand sanitizer. ? Change your dressing as told by your health care provider. ? Leave stitches (sutures), skin glue, or adhesive strips in place. These skin closures may need to stay in place for 2 weeks or longer. If adhesive strip edges start to loosen and curl up, you may trim the loose edges. Do not remove adhesive strips completely unless your health care provider tells you to do that.  Check your insertion site every day for signs of infection. Check for: ? Redness, swelling, or pain. ? Fluid or blood. ? Pus or a bad smell. ? Warmth.  Do not take baths, swim, or use a hot tub until your health care provider approves.  You may shower 24-48 hours after the procedure, or as directed by your health care provider. ? Remove the dressing and gently wash the site with plain soap and water. ? Pat the area dry with a clean towel. ? Do not rub the site. That could cause bleeding.  Do not apply powder or lotion to the site. Activity   For 24 hours after the procedure, or as directed by your health care provider: ? Do not flex or bend the affected arm. ? Do not push or pull heavy objects with the affected arm. ? Do not  drive yourself home from the hospital or clinic. You may drive 24 hours after the procedure unless your health care provider tells you not to. ? Do not operate machinery or power tools.  Do not lift anything that is heavier than 10 lb (4.5 kg), or the limit that you are told, until your health care provider says that it is safe.  Ask your health care provider when it is okay to: ? Return to work or school. ? Resume usual physical activities or sports. ? Resume sexual activity. General instructions  If the catheter site starts to bleed, raise your arm and put firm pressure on the site. If the bleeding does not stop, get help right away. This is a medical emergency.  If you went home on the same day as your procedure, a responsible adult should be with you for the first 24 hours after you arrive home.  Keep all follow-up visits as told by your health care provider. This is important. Contact a health care provider if:  You have a fever.  You have redness, swelling, or yellow drainage around your insertion site. Get help right away if:  You have unusual pain at the radial site.  The catheter insertion area swells very fast.  The insertion area is bleeding, and the bleeding does not stop when you hold steady pressure on the area.  Your arm or hand becomes pale, cool, tingly, or numb. These symptoms may represent a serious problem   that is an emergency. Do not wait to see if the symptoms will go away. Get medical help right away. Call your local emergency services (911 in the [place] Do not drive yourself to the hospital. Summary  After the procedure, it is common to have bruising and tenderness at the site.  Follow instructions from your health care provider about how to take care of your radial site wound. Check the wound every day for signs of infection.  Do not lift anything that is heavier than 10 lb (4.5 kg), or the limit that you are told, until your health care provider says  that it is safe. This information is not intended to replace advice given to you by your health care provider. Make sure you discuss any questions you have with your health care provider. Document Revised: 04/20/2017 Document Reviewed: 04/20/2017 Elsevier Patient Education  2020 Elsevier Inc.  

## 2019-07-27 NOTE — H&P (Signed)
Cardiology Office Note   Date:  06/12/2019   ID:  Noble, Cicalese 1961/02/16, MRN [id number]  PCP:  Estanislado Pandy, MD      Cardiologist:   [name], MD   No chief complaint on file.     History of Present Illness: Terrence Swanson is a 59 y.o. male who presents for a follow-up visit regarding  peripheral arterial disease but also referred by Dr. Neita Carp to evaluate new complaints of chest pain. He has no previous cardiac history. He has prolonged history of tobacco use . He underwent kissing stent placement to the bilateral common iliac arteries extending into the distal aorta in April, 2016  for severe claudication. He developed recurrent severe claudication in 2018 due to in-stent restenosis.  He underwent successful bilateral common iliac artery covered stent placement into the distal aorta in May 2018 . He quit smoking after that.  His vascular studies did show some scarring and elevated velocities within the stents but ABI remained normal.  Recently, he started having left-sided chest pain which is localized with no radiation.  It is described as dull aching sensation that happens mostly later in the day and evenings.  It can happen at rest or with exertion and does not seem to worsen with physical activities.  Its not related to food and seems to be different from his GERD symptoms.  He feels more fatigued with some shortness of breath. He denies leg claudication but his legs feel weak.  He does admit to intermittent smoking.       Past Medical History:  Diagnosis Date  . Anxiety   . Depression          Past Surgical History:  Procedure Laterality Date  . ABDOMINAL AORTAGRAM N/A 07/24/2014   Procedure: ABDOMINAL Ronny Flurry;  Surgeon: Iran Ouch, MD;  Location: [facility];  Service: Cardiovascular;  Laterality: N/A;  . ABDOMINAL AORTOGRAM N/A 07/28/2016   Procedure: Abdominal Aortogram;  Surgeon: Iran Ouch, MD;  Location: [facility];  Service: Cardiovascular;  Laterality: N/A;  . PERCUTANEOUS STENT INTERVENTION N/A 07/24/2014   Procedure: PERCUTANEOUS STENT INTERVENTION;  Surgeon: Iran Ouch, MD;  Location: [facility];  Service: Cardiovascular;  Laterality: N/A;  rt and left common iliacs  . PERIPHERAL VASCULAR INTERVENTION Bilateral 07/28/2016   Procedure: Peripheral Vascular Intervention;  Surgeon: Iran Ouch, MD;  Location: [facility];  Service: Cardiovascular;  Laterality: Bilateral;  COMMON ILIACS           Current Outpatient Medications  Medication Sig Dispense Refill  . aspirin EC 81 MG tablet Take 1 tablet (81 mg total) by mouth daily.    Marland Kitchen atorvastatin (LIPITOR) 40 MG tablet Take 1 tablet (40 mg total) by mouth daily. 30 tablet 10  . buPROPion (WELLBUTRIN SR) 150 MG 12 hr tablet Take 1 tablet by mouth in the morning and at bedtime.    . clonazePAM (KLONOPIN) 1 MG tablet Take 1 mg by mouth 3 (three) times daily as needed for anxiety.    . dicyclomine (BENTYL) 10 MG capsule Take 10 mg by mouth 3 (three) times daily.    . nitroGLYCERIN (NITROSTAT) 0.4 MG SL tablet Place 0.4 mg under the tongue as directed.    . pantoprazole (PROTONIX) 40 MG tablet Take 40 mg by mouth daily.     No current facility-administered medications for this visit.    Allergies:   Patient has no known allergies.  Social History:  The patient  reports that he has been smoking. He has never used smokeless tobacco. He reports that he does not drink alcohol or use drugs.   Family History:  The patient's family history includes Cancer in his father; Diabetes in his mother.    ROS:  Please see the history of present illness.   Otherwise, review of systems are positive for none.   All other systems are reviewed and negative.    PHYSICAL EXAM: VS:  BP 108/62   Pulse 72   Resp 15   Ht 5\' 11"  (1.803 m)   Wt 190 lb 6.4 oz (86.4 kg)   SpO2 97%   BMI 26.56 kg/m  , BMI Body mass index is  26.56 kg/m. GEN: Well nourished, well developed, in no acute distress  HEENT: normal  Neck: no JVD, carotid bruits, or masses Cardiac: RRR; no murmurs, rubs, or gallops,no edema  Respiratory:  clear to auscultation bilaterally, normal work of breathing GI: soft, nontender, nondistended, + BS MS: no deformity or atrophy  Skin: warm and dry, no rash Neuro:  Strength and sensation are intact Psych: euthymic mood, full affect Vascular: Femoral pulse is palpable bilaterally but slightly diminished   EKG:  EKG is  Not ordered today. I reviewed recent EKG done with Dr. which showed normal sinus rhythm with no significant ST or T wave changes.  There was incomplete right bundle branch block.  Recent Labs: No results found for requested labs within last 8760 hours.    Lipid Panel Labs (Brief)          Component Value Date/Time   CHOL 127 08/31/2016 1110   TRIG 102 08/31/2016 1110   HDL 35 (L) 08/31/2016 1110   CHOLHDL 3.6 08/31/2016 1110   CHOLHDL 6 08/13/2014 1010   VLDL 29.0 08/13/2014 1010   LDLCALC 72 08/31/2016 1110           Wt Readings from Last 3 Encounters:  06/12/19 190 lb 6.4 oz (86.4 kg)  05/16/18 200 lb (90.7 kg)  09/06/17 188 lb (85.3 kg)       No flowsheet data found.    ASSESSMENT AND PLAN:  1.  Chest pain: Somewhat atypical but has persisted for about a month and he has multiple risk factors for coronary artery disease.  I recommend evaluation with CTA of the coronary arteries with FFR.  2. Peripheral arterial disease: No recurrent claudication in his pulses are unchanged from last year.  He does have evidence of restenosis by duplex but his ABI and toe pressure has remained normal with no evidence of claudication.   I requested repeat vascular studies to be done in May.  3. Previous Tobacco use: He does admit to intermittent smoking.  Discussed the importance of complete cessation.  4. Hyperlipidemia: He is concerned  about atorvastatin causing confusion and memory loss.  Given his risk profile, I explained to him that the benefits outweigh risks .  He should continue the medication.   Disposition:   FU with me in 6 months  Signed,   June, MD  06/12/2019 11:45 AM    [facility]  Addendum on April 30 of 2021: The patient was seen and examined and I reviewed my previous documentation.  In summary, the patient has known history of peripheral arterial disease, hyperlipidemia and previous tobacco use.  He was seen recently for intermittent chest pain at rest and with exertion with associated shortness of breath.  He underwent a [employer] which showed evidence of prior inferior infarct with minimal peri-infarct ischemia.  The patient has no prior history of documented coronary artery disease.  Based on his symptoms and abnormal stress test I have recommended proceeding with left heart catheterization possible PCI.  I discussed the procedure in details as well as risks and benefits.  By physical exam, his radial pulses normal.  Heart is regular with no murmurs.  Lungs are clear to auscultation with no significant edema.  Distal pulses are palpable.

## 2019-08-07 ENCOUNTER — Other Ambulatory Visit: Payer: Self-pay

## 2019-08-07 MED ORDER — ATORVASTATIN CALCIUM 40 MG PO TABS: 40.0000 mg | ORAL_TABLET | Freq: Every day | ORAL | 10 refills | Status: AC

## 2019-08-13 ENCOUNTER — Telehealth: Payer: Self-pay | Admitting: Cardiovascular Disease

## 2019-08-13 ENCOUNTER — Encounter (HOSPITAL_COMMUNITY): Payer: 59

## 2019-08-13 NOTE — Telephone Encounter (Signed)
LMOM to call and schedule appt with Dr. Kirke Corin

## 2019-08-20 ENCOUNTER — Telehealth: Payer: Self-pay | Admitting: Cardiovascular Disease

## 2019-08-20 NOTE — Telephone Encounter (Signed)
Yes that should be fine

## 2019-08-20 NOTE — Telephone Encounter (Signed)
Patient states he feels like he does not need to follow up with Dr. Kirke Corin for a 1 month post procedure appt. He would like his lab results, heart cath results and any recent test results sent to his PCP Dr. Fara Chute. Fax number [phone].

## 2019-08-20 NOTE — Telephone Encounter (Signed)
Returned the call to the patient. He had a cardiac cath on 4/30 and stated that he does not feel like he needs to follow up right now with Dr. Kirke Corin. He denies any chest pain and stated that his site has healed well.

## 2019-08-21 NOTE — Telephone Encounter (Signed)
Recall placed for September.

## 2019-08-21 NOTE — Telephone Encounter (Signed)
Documents faxed to number provided per patient request.

## 2019-09-10 ENCOUNTER — Encounter: Payer: Self-pay | Admitting: Pulmonary Disease

## 2019-09-10 ENCOUNTER — Ambulatory Visit (INDEPENDENT_AMBULATORY_CARE_PROVIDER_SITE_OTHER): Payer: 59 | Admitting: Pulmonary Disease

## 2019-09-10 ENCOUNTER — Ambulatory Visit (INDEPENDENT_AMBULATORY_CARE_PROVIDER_SITE_OTHER)
Admission: RE | Admit: 2019-09-10 | Discharge: 2019-09-10 | Disposition: A | Discharge status: Home / Self Care | Payer: 59 | Source: Ambulatory Visit | Attending: Pulmonary Disease | Admitting: Pulmonary Disease

## 2019-09-10 ENCOUNTER — Other Ambulatory Visit: Payer: Self-pay

## 2019-09-10 VITALS — BP 110/70 | HR 67 | Temp 98.0°F | Ht 71.0 in | Wt 191.2 lb

## 2019-09-10 DIAGNOSIS — J449 Chronic obstructive pulmonary disease, unspecified: Secondary | ICD-10-CM

## 2019-09-10 DIAGNOSIS — Z720 Tobacco use: Secondary | ICD-10-CM

## 2019-09-10 DIAGNOSIS — R071 Chest pain on breathing: Secondary | ICD-10-CM

## 2019-09-10 NOTE — Assessment & Plan Note (Signed)
Chest x-ray obtained and personally reviewed which shows hyperinflation no infiltrates or nodules. We'll proceed with CT chest without contrast to clarify cause for persistent chest pain. Does not appear to be musculoskeletal on exam, no pleural pathology noted on chest x-ray

## 2019-09-10 NOTE — Progress Notes (Signed)
Subjective:    Patient ID: Terrence Swanson, male    DOB: 1961/02/04, 59 y.o.   MRN: [id number]  HPI   Chief Complaint  Patient presents with  . Consult    coughing up clear sputum. Denies SOB. Pt having chest pain since Jan. In the last month he only has it while taking deep breaths.    59 year old heavy ex-smoker presents for evaluation of chest pain. He reports substernal chest pain not related to exertion that started in January 2021, insidious onset, initially would come and go every 15 minutes, he would feel it more towards evening, not related to work or exertion, will resolve spontaneously.  He points to an area slightly left of center, rate 3-4/10. Over the past 1 month this is settled into a pleuritic type pain that only comes on deep inspiration.  Associated with mild cough and clear sputum production, no hemoptysis. He underwent stress test and cardiac cath which did not show significant CAD.  He denies weight loss, frequent chest colds or wheezing. He denies significant heartburn, he takes Protonix daily. He is a heavy smoker, quit 08/2018, smoked 1.5 packs/day starting at age 1.  He required stents in both femoral arteries in 2018    Past Medical History:  Diagnosis Date  . Anxiety   . Depression     Past Surgical History:  Procedure Laterality Date  . ABDOMINAL AORTAGRAM N/A 07/24/2014   Procedure: ABDOMINAL Ronny Flurry;  Surgeon: Iran Ouch, MD;  Location: [facility];  Service: Cardiovascular;  Laterality: N/A;  . ABDOMINAL AORTOGRAM N/A 07/28/2016   Procedure: Abdominal Aortogram;  Surgeon: Iran Ouch, MD;  Location: [facility];  Service: Cardiovascular;  Laterality: N/A;  . LEFT HEART CATH AND CORONARY ANGIOGRAPHY N/A 07/27/2019   Procedure: LEFT HEART CATH AND CORONARY ANGIOGRAPHY;  Surgeon: Iran Ouch, MD;  [facility]: [facility];  Service: Cardiovascular;  Laterality: N/A;  . PERCUTANEOUS STENT INTERVENTION N/A 07/24/2014    Procedure: PERCUTANEOUS STENT INTERVENTION;  Surgeon: Iran Ouch, MD;  Location: [facility];  Service: Cardiovascular;  Laterality: N/A;  rt and left common iliacs  . PERIPHERAL VASCULAR INTERVENTION Bilateral 07/28/2016   Procedure: Peripheral Vascular Intervention;  Surgeon: Iran Ouch, MD;  Location: [facility];  Service: Cardiovascular;  Laterality: Bilateral;  COMMON ILIACS   No Known Allergies  Social History   Socioeconomic History  . Marital status: Legally Separated    Spouse name: Not on file  . Number of children: Not on file  . Years of education: Not on file  . Highest education level: Not on file  Occupational History  . Not on file  Tobacco Use  . Smoking status: Former Smoker    Packs/day: 1.50    Years: 45.00    Pack years: 67.50    Types: Cigarettes    Quit date: 08/28/2018    Years since quitting: 1.0  . Smokeless tobacco: Never Used  Vaping Use  . Vaping Use: Never used  Substance and Sexual Activity  . Alcohol use: No  . Drug use: No  . Sexual activity: Not on file  Other Topics Concern  . Not on file  Social History Narrative  . Not on file   Social Determinants of Health   Financial Resource Strain:   . Difficulty of Paying Living Expenses:   Food Insecurity:   . Worried About Programme researcher, broadcasting/film/video in the Last Year:   . [employer]  of Food in the Last Year:   Transportation Needs:   . Film/video editor (Medical):   Marland Kitchen Lack of Transportation (Non-Medical):   Physical Activity:   . Days of Exercise per Week:   . Minutes of Exercise per Session:   Stress:   . Feeling of Stress :   Social Connections:   . Frequency of Communication with Friends and Family:   . Frequency of Social Gatherings with Friends and Family:   . Attends Religious Services:   . Active Member of Clubs or Organizations:   . Attends Archivist Meetings:   Marland Kitchen Marital Status:   Intimate Partner Violence:   . Fear of Current or Ex-Partner:   .  Emotionally Abused:   Marland Kitchen Physically Abused:   . Sexually Abused:      Family History  Problem Relation Age of Onset  . Diabetes Mother   . Cancer Father        Throat     Review of Systems Constitutional: negative for anorexia, fevers and sweats  Eyes: negative for irritation, redness and visual disturbance  Ears, nose, mouth, throat, and face: negative for earaches, epistaxis, nasal congestion and sore throat  Respiratory: negative for  dyspnea on exertion, sputum and wheezing  Cardiovascular: negative for dyspnea, lower extremity edema, orthopnea, palpitations and syncope  Gastrointestinal: negative for abdominal pain, constipation, diarrhea, melena, nausea and vomiting  Genitourinary:negative for dysuria, frequency and hematuria  Hematologic/lymphatic: negative for bleeding, easy bruising and lymphadenopathy  Musculoskeletal:negative for arthralgias, muscle weakness and stiff joints  Neurological: negative for coordination problems, gait problems, headaches and weakness  Endocrine: negative for diabetic symptoms including polydipsia, polyuria and weight loss     Objective:   Physical Exam  Gen. Pleasant, well-nourished, in no distress, normal affect ENT - no pallor,icterus, no post nasal drip Neck: No JVD, no thyromegaly, no carotid bruits Lungs: no use of accessory muscles, no dullness to percussion, clear without rales or rhonchi  Cardiovascular: Rhythm regular, heart sounds  normal, no murmurs or gallops, no peripheral edema , no reproducible tenderness Abdomen: soft and non-tender, no hepatosplenomegaly, BS normal. Musculoskeletal: No deformities, no cyanosis or clubbing Neuro:  alert, non focal       Assessment & Plan:

## 2019-09-10 NOTE — Patient Instructions (Signed)
Chest x-ray today. If cleared, will proceed with screening CT chest for lung cancer

## 2019-09-10 NOTE — Assessment & Plan Note (Signed)
If he develops more symptoms, proceed with spirometry pre and post given hyperinflation on chest x-ray , likely has COPD

## 2019-09-11 ENCOUNTER — Telehealth: Payer: Self-pay | Admitting: *Deleted

## 2019-09-11 ENCOUNTER — Telehealth: Payer: Self-pay | Admitting: Pulmonary Disease

## 2019-09-11 NOTE — Telephone Encounter (Signed)
Terrence Swanson refused his September appointment. Recall removed.

## 2019-09-11 NOTE — Telephone Encounter (Signed)
Chest x-ray showed hyperinflation consistent with COPD/emphysema related to smoking No other cause of chest pain identified Hence I would like him to proceed with CT chest like we discussed

## 2019-09-11 NOTE — Telephone Encounter (Signed)
I just tried to schedule this Lung Cancer Screening Ct for the patient and when I called he stated that he hasn't gotten the results of the chest xray yet. He didn't want to schedule the Lung Cancer Screening CT until then

## 2019-09-12 NOTE — Telephone Encounter (Signed)
I have spoke with the patient and made him aware that I needed to get his CT approved before I could get it scheduled. I told him I would call him back

## 2019-09-12 NOTE — Telephone Encounter (Signed)
Called and spoke with pt letting him know the results of the cxr. Stated to him that we will have Synetta Fail return call to get the CT scheduled and he verbalized understanding. Routing back to [place].

## 2019-09-13 NOTE — Telephone Encounter (Signed)
I got his LCS CT approved through [facility]. His CT has been scheduled for 10/10/2019 @ 11:00am at [facility]. Patient is aware of the appt and location

## 2019-10-10 ENCOUNTER — Ambulatory Visit (HOSPITAL_COMMUNITY)
Admission: RE | Admit: 2019-10-10 | Discharge: 2019-10-10 | Disposition: A | Discharge status: Home / Self Care | Payer: 59 | Source: Ambulatory Visit | Attending: Pulmonary Disease | Admitting: Pulmonary Disease

## 2019-10-10 ENCOUNTER — Other Ambulatory Visit: Payer: Self-pay

## 2019-10-10 DIAGNOSIS — R071 Chest pain on breathing: Secondary | ICD-10-CM | POA: Diagnosis not present

## 2019-10-10 DIAGNOSIS — Z720 Tobacco use: Secondary | ICD-10-CM | POA: Diagnosis present

## 2019-10-16 ENCOUNTER — Telehealth: Payer: Self-pay | Admitting: Pulmonary Disease

## 2019-10-16 NOTE — Telephone Encounter (Signed)
Called and spoke with patient and verified PCP provider. CT and CXR sent over to Dr. Neita Carp. Nothing further needed at this time

## 2019-10-22 ENCOUNTER — Telehealth: Payer: Self-pay | Admitting: Pulmonary Disease

## 2019-10-22 NOTE — Telephone Encounter (Signed)
Called spoke with patient.  Let him know dr,. Alva's recommendations Patient is passing on scheduling PFTs at this time He will call if he changes his mind  Nothing further needed at this time

## 2019-10-22 NOTE — Telephone Encounter (Signed)
Called and spoke patient he doesn't understand why the Chest xray in June says hyperinflation and the CT in July says no cause for chest pain. Patient has pain with every deep breath he takes,  Dr. Vassie Loll please advise.

## 2019-10-22 NOTE — Telephone Encounter (Signed)
Hyperinflation indicates smoking induced lung disease such as emphysema CT shows mild emphysema & scarring - we would need PFTs to clarify degree of emphysema or COPD No cause for chest pain identified on this CT (emphysema does not cause chest pain ) Evidence of calcium in his arteries but He has undergone cardiac eval already

## 2019-11-02 ENCOUNTER — Telehealth: Payer: Self-pay | Admitting: Cardiovascular Disease

## 2019-11-02 NOTE — Telephone Encounter (Signed)
Called and spoke with pt, he would like his labs sent to his pcp. Notified we would fax them today. Pt verbalized understanding with no other questions at this time.

## 2019-11-02 NOTE — Telephone Encounter (Signed)
Patient needs a copy of his recent labs sent to his PCP, Dr. Fara Chute at [facility] in [place].

## 2020-11-01 IMAGING — CT CT CHEST LUNG CANCER SCREENING LOW DOSE W/O CM
1 series · 10 of 10 positions shown, 13 images · non-contrast
Comparison: No priors.

CLINICAL DATA: 59-year-old male former smoker (quit 1 year ago)
with 68 pack-year history of smoking. Lung cancer screening
examination.

EXAM:
CT CHEST WITHOUT CONTRAST LOW-DOSE FOR LUNG CANCER SCREENING
TECHNIQUE: Multidetector CT imaging of the chest was performed following the
standard protocol without IV contrast.

[ct lung segmentation data · axial · 0.77mm/px · z∈[-180,-180]mm · 10 of 323 frames shown]
[frame 1/323  mediastinal]
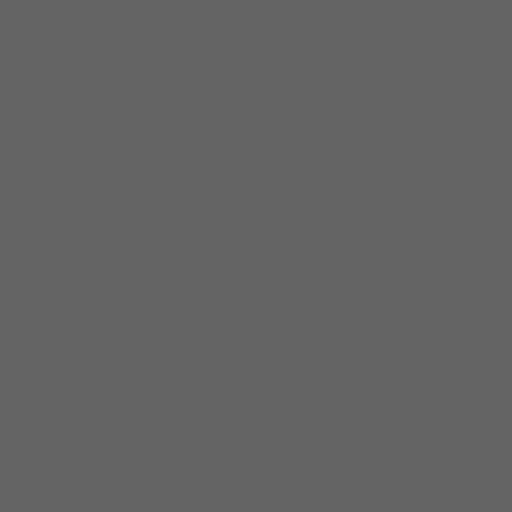
[frame 1/323  lung]
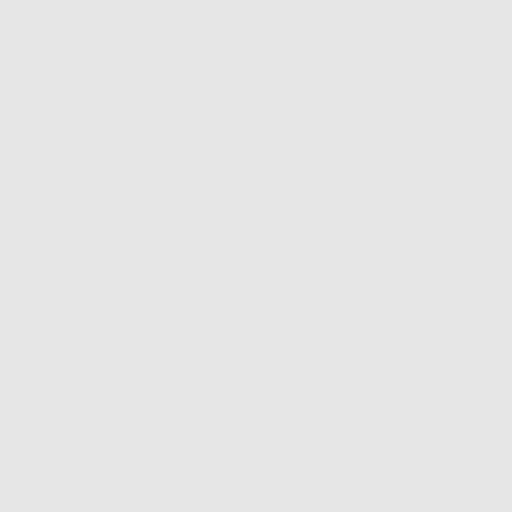
[frame 36/323  lung]
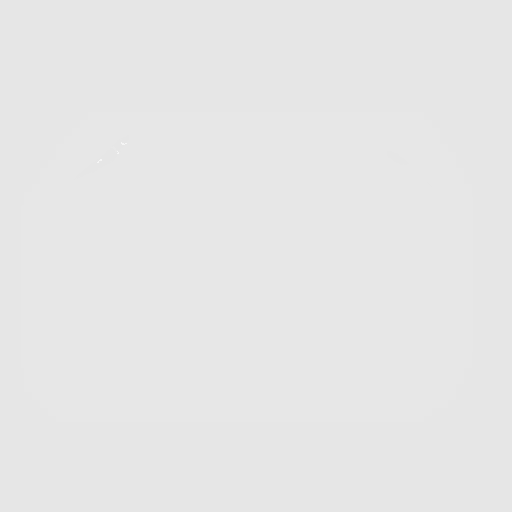
[frame 72/323  lung]
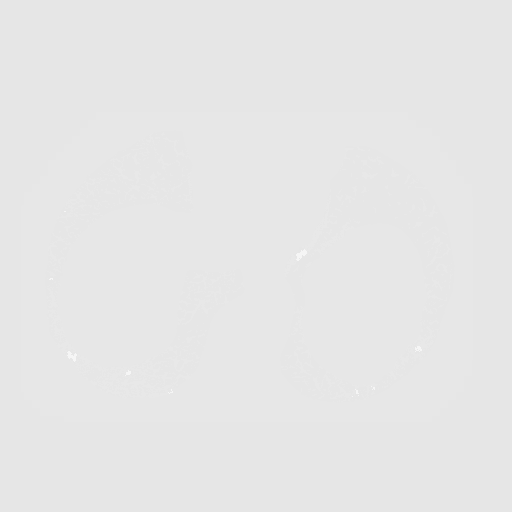
[frame 108/323  lung]
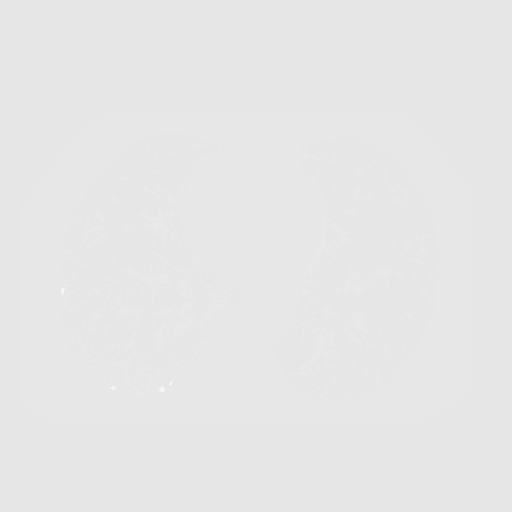
[frame 144/323  mediastinal]
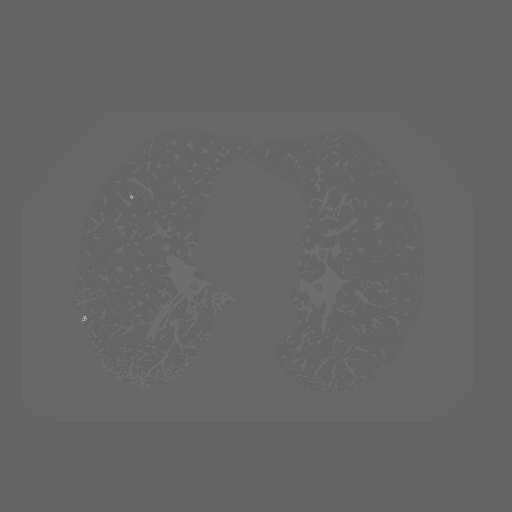
[frame 144/323  lung]
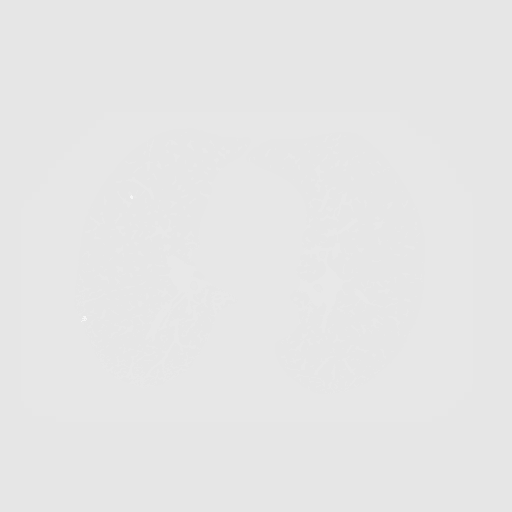
[frame 179/323  lung]
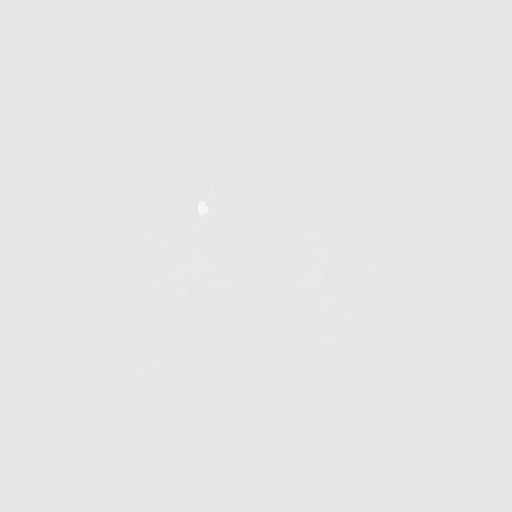
[frame 215/323  lung]
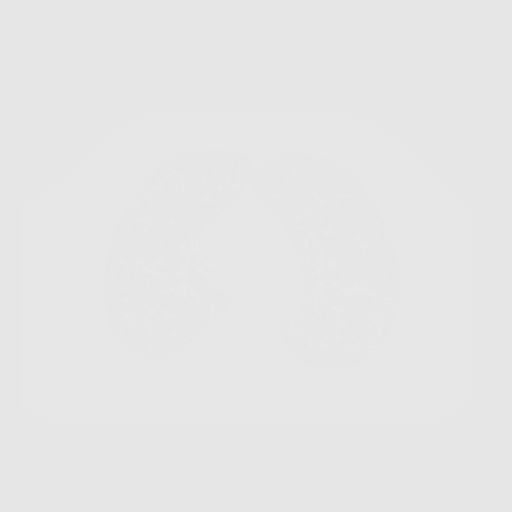
[frame 251/323  lung]
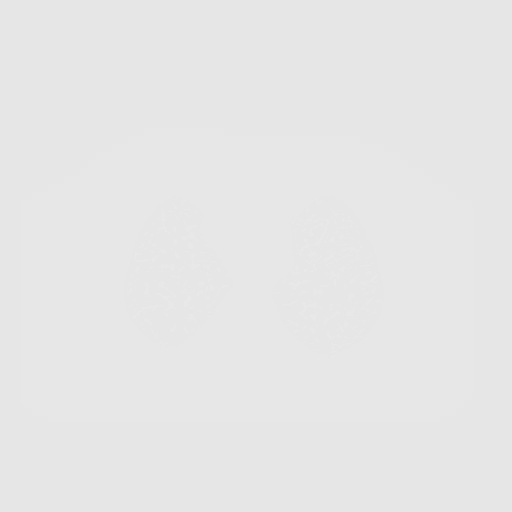
[frame 287/323  mediastinal]
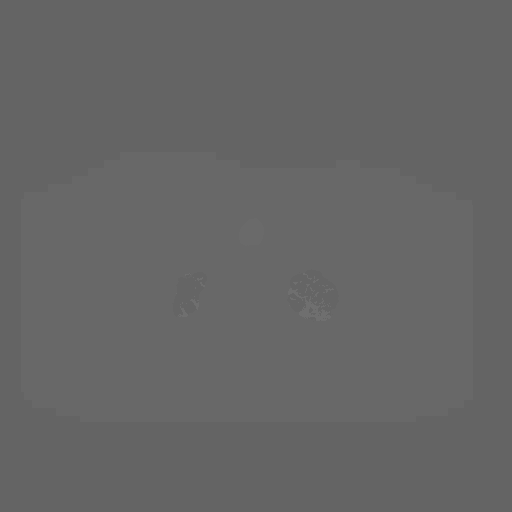
[frame 287/323  lung]
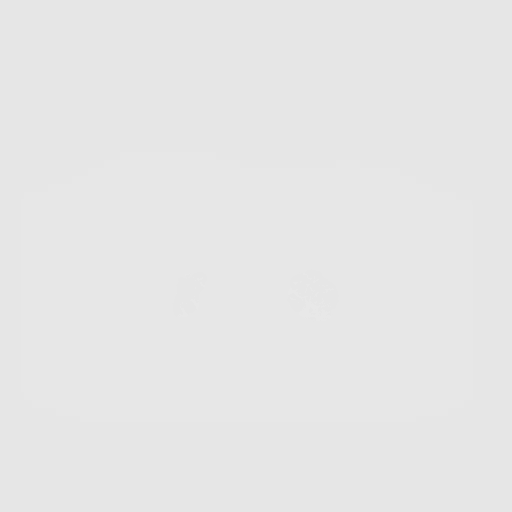
[frame 323/323  lung]
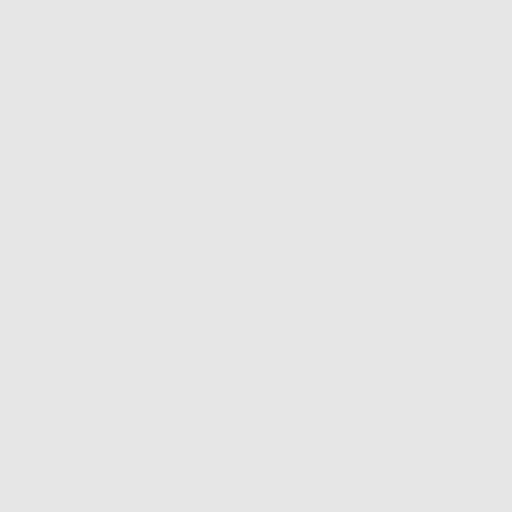

[10 of 10 positions shown; findings below may reference images not displayed]

FINDINGS: Cardiovascular: Heart size is normal. There is no significant
pericardial fluid, thickening or pericardial calcification. There is
aortic atherosclerosis, as well as atherosclerosis of the great
vessels of the mediastinum and the coronary arteries, including
calcified atherosclerotic plaque in the left main, left anterior
descending, left circumflex and right coronary arteries.

Mediastinum/Nodes: No pathologically enlarged mediastinal or hilar
lymph nodes. Please note that accurate exclusion of hilar adenopathy
is limited on noncontrast CT scans. Esophagus is unremarkable in
appearance. No axillary lymphadenopathy.

Lungs/Pleura: Tiny pulmonary nodules in the left lung measuring
mm in volume derived mean diameter. No other larger more suspicious
appearing pulmonary nodules or masses are noted. No acute
consolidative airspace disease. No pleural effusions. Diffuse
bronchial wall thickening with mild centrilobular and paraseptal
emphysema. Peripheral areas of bronchiectasis and subpleural
scarring in the right lower lobe, likely reflective of post
infectious or inflammatory scarring.

Upper Abdomen: Aortic atherosclerosis.

Musculoskeletal: There are no aggressive appearing lytic or blastic
lesions noted in the visualized portions of the skeleton.
IMPRESSION: 1. Lung-RADS 2S, benign appearance or behavior. Continue annual
screening with low-dose chest CT without contrast in 12 months.
2. The "S" modifier above refers to potentially clinically
significant non lung cancer related findings. Specifically, there is
aortic atherosclerosis, in addition to left main and 3 vessel
coronary artery disease. Please note that although the presence of
coronary artery calcium documents the presence of coronary artery
disease, the severity of this disease and any potential stenosis
cannot be assessed on this non-gated CT examination. Assessment for
potential risk factor modification, dietary therapy or pharmacologic
therapy may be warranted, if clinically indicated.
3. Mild diffuse bronchial wall thickening with mild centrilobular
and paraseptal emphysema; imaging findings suggestive of underlying
COPD.

Aortic Atherosclerosis (ZD7F8-PWQ.Q) and Emphysema ([id number].[id number]).

## 2021-02-24 ENCOUNTER — Ambulatory Visit (INDEPENDENT_AMBULATORY_CARE_PROVIDER_SITE_OTHER): Payer: Medicaid Other | Admitting: General Surgery

## 2021-02-24 ENCOUNTER — Other Ambulatory Visit: Payer: Self-pay

## 2021-02-24 ENCOUNTER — Encounter: Payer: Self-pay | Admitting: General Surgery

## 2021-02-24 VITALS — BP 114/73 | HR 66 | Temp 98.7°F | Resp 16 | Ht 72.0 in | Wt 180.0 lb

## 2021-02-24 DIAGNOSIS — K4090 Unilateral inguinal hernia, without obstruction or gangrene, not specified as recurrent: Secondary | ICD-10-CM | POA: Diagnosis not present

## 2021-02-25 NOTE — Progress Notes (Signed)
[name]; [id number]; 02/23/1961   HPI Patient is a 60 year old white male who was referred to my care by Dr. Neita Carp for evaluation and treatment of left groin pain.  The patient has a question as to whether he has a left inguinal hernia.  He states he has had left groin pain intermittently for over a decade.  He has been evaluated in the past and no hernia has been found.  He does have a history of diverticulosis.  He states the pain is worse with straining or coughing.  He thought it popped out this morning. Past Medical History:  Diagnosis Date   Anxiety    Depression     Past Surgical History:  Procedure Laterality Date   ABDOMINAL AORTAGRAM N/A 07/24/2014   Procedure: ABDOMINAL AORTAGRAM;  Surgeon: Iran Ouch, MD;  Location: [facility];  Service: Cardiovascular;  Laterality: N/A;   ABDOMINAL AORTOGRAM N/A 07/28/2016   Procedure: Abdominal Aortogram;  Surgeon: Iran Ouch, MD;  Location: [facility];  Service: Cardiovascular;  Laterality: N/A;   LEFT HEART CATH AND CORONARY ANGIOGRAPHY N/A 07/27/2019   Procedure: LEFT HEART CATH AND CORONARY ANGIOGRAPHY;  Surgeon: Iran Ouch, MD;  Location: [facility];  Service: Cardiovascular;  Laterality: N/A;   PERCUTANEOUS STENT INTERVENTION N/A 07/24/2014   Procedure: PERCUTANEOUS STENT INTERVENTION;  Surgeon: Iran Ouch, MD;  Location: [facility];  Service: Cardiovascular;  Laterality: N/A;  rt and left common iliacs   PERIPHERAL VASCULAR INTERVENTION Bilateral 07/28/2016   Procedure: Peripheral Vascular Intervention;  Surgeon: Iran Ouch, MD;  Location: [facility];  Service: Cardiovascular;  Laterality: Bilateral;  COMMON ILIACS    Family History  Problem Relation Age of Onset   Diabetes Mother    Cancer Father        Throat    Current Outpatient Medications on File Prior to Visit  Medication Sig Dispense Refill   aspirin EC 81 MG tablet Take 1 tablet (81 mg total) by mouth daily. (Patient  taking differently: Take 81 mg by mouth at bedtime.)     atorvastatin (LIPITOR) 40 MG tablet Take 1 tablet (40 mg total) by mouth daily. 30 tablet 10   clonazePAM (KLONOPIN) 1 MG tablet Take 1 mg by mouth at bedtime.     pantoprazole (PROTONIX) 40 MG tablet Take 40 mg by mouth daily.     sertraline (ZOLOFT) 50 MG tablet Take 50 mg by mouth daily.     No current facility-administered medications on file prior to visit.    No Known Allergies  Social History   Substance and Sexual Activity  Alcohol Use No    Social History   Tobacco Use  Smoking Status Former   Packs/day: 1.50   Years: 45.00   Pack years: 67.50   Types: Cigarettes   Quit date: 08/28/2018   Years since quitting: 2.4  Smokeless Tobacco Never    Review of Systems  Constitutional: Negative.   HENT: Negative.    Eyes: Negative.   Respiratory: Negative.    Cardiovascular: Negative.   Gastrointestinal:  Positive for abdominal pain.  Genitourinary: Negative.   Musculoskeletal:  Positive for back pain and joint pain.  Skin: Negative.   Neurological: Negative.   Endo/Heme/Allergies: Negative.   Psychiatric/Behavioral:  Positive for depression.    Objective   Vitals:   02/24/21 1511  BP: 114/73  Pulse: 66  Resp: 16  Temp: 98.7 F (37.1 C)  SpO2: 93%  Physical Exam Vitals reviewed.  Constitutional:      Appearance: Normal appearance. He is not ill-appearing.  HENT:     Head: Normocephalic and atraumatic.  Cardiovascular:     Rate and Rhythm: Normal rate and regular rhythm.     Heart sounds: Normal heart sounds. No murmur heard.   No friction rub. No gallop.  Pulmonary:     Effort: Pulmonary effort is normal. No respiratory distress.     Breath sounds: Normal breath sounds. No stridor. No wheezing, rhonchi or rales.  Abdominal:     General: Abdomen is flat. There is no distension.     Palpations: Abdomen is soft. There is no mass.     Tenderness: There is no abdominal tenderness. There is no  guarding or rebound.     Hernia: A hernia is present.     Comments: Reducible left inguinal hernia found.  Genitourinary:    Testes: Normal.  Skin:    General: Skin is warm and dry.  Neurological:     Mental Status: He is alert and oriented to person, place, and time.  Primary care notes reviewed  Assessment  Left inguinal hernia Plan  I discussed the risks and benefits of performing a left inguinal herniorrhaphy with mesh with the patient.  He currently has some anxiety about undergoing general anesthesia, but he will think about the surgery and let us know if he wants to proceed.  The risk of incarceration is low but I did explain to the patient how to reduce the hernia.

## 2021-03-05 NOTE — H&P (Signed)
Terrence Swanson; [id number]; [birth year]   HPI Patient is a 60 year old white male who was referred to my care by Dr. Neita Carp for evaluation and treatment of left groin pain.  The patient has a question as to whether he has a left inguinal hernia.  He states he has had left groin pain intermittently for over a decade.  He has been evaluated in the past and no hernia has been found.  He does have a history of diverticulosis.  He states the pain is worse with straining or coughing.  He thought it popped out this morning. Past Medical History:  Diagnosis Date   Anxiety    Depression     Past Surgical History:  Procedure Laterality Date   ABDOMINAL AORTAGRAM N/A 07/24/2014   Procedure: ABDOMINAL AORTAGRAM;  Surgeon: Iran Ouch, MD;  Location: [facility];  Service: Cardiovascular;  Laterality: N/A;   ABDOMINAL AORTOGRAM N/A 07/28/2016   Procedure: Abdominal Aortogram;  Surgeon: Iran Ouch, MD;  Location: [facility];  Service: Cardiovascular;  Laterality: N/A;   LEFT HEART CATH AND CORONARY ANGIOGRAPHY N/A 07/27/2019   Procedure: LEFT HEART CATH AND CORONARY ANGIOGRAPHY;  Surgeon: Iran Ouch, MD;  Location: [facility];  Service: Cardiovascular;  Laterality: N/A;   PERCUTANEOUS STENT INTERVENTION N/A 07/24/2014   Procedure: PERCUTANEOUS STENT INTERVENTION;  Surgeon: Iran Ouch, MD;  Location: [facility];  Service: Cardiovascular;  Laterality: N/A;  rt and left common iliacs   PERIPHERAL VASCULAR INTERVENTION Bilateral 07/28/2016   Procedure: Peripheral Vascular Intervention;  Surgeon: Iran Ouch, MD;  Location: [facility];  Service: Cardiovascular;  Laterality: Bilateral;  COMMON ILIACS    Family History  Problem Relation Age of Onset   Diabetes Mother    Cancer Father        Throat    Current Outpatient Medications on File Prior to Visit  Medication Sig Dispense Refill   aspirin EC 81 MG tablet Take 1 tablet (81 mg total) by mouth daily. (Patient  taking differently: Take 81 mg by mouth at bedtime.)     atorvastatin (LIPITOR) 40 MG tablet Take 1 tablet (40 mg total) by mouth daily. 30 tablet 10   clonazePAM (KLONOPIN) 1 MG tablet Take 1 mg by mouth at bedtime.     pantoprazole (PROTONIX) 40 MG tablet Take 40 mg by mouth daily.     sertraline (ZOLOFT) 50 MG tablet Take 50 mg by mouth daily.     No current facility-administered medications on file prior to visit.    No Known Allergies  Social History   Substance and Sexual Activity  Alcohol Use No    Social History   Tobacco Use  Smoking Status Former   Packs/day: 1.50   Years: 45.00   Pack years: 67.50   Types: Cigarettes   Quit date: 08/28/2018   Years since quitting: 2.4  Smokeless Tobacco Never    Review of Systems  Constitutional: Negative.   HENT: Negative.    Eyes: Negative.   Respiratory: Negative.    Cardiovascular: Negative.   Gastrointestinal:  Positive for abdominal pain.  Genitourinary: Negative.   Musculoskeletal:  Positive for back pain and joint pain.  Skin: Negative.   Neurological: Negative.   Endo/Heme/Allergies: Negative.   Psychiatric/Behavioral:  Positive for depression.    Objective   Vitals:   02/24/21 1511  BP: 114/73  Pulse: 66  Resp: 16  Temp: 98.7 F (37.1 C)  SpO2: 93%  Physical Exam Vitals reviewed.  Constitutional:      Appearance: Normal appearance. He is not ill-appearing.  HENT:     Head: Normocephalic and atraumatic.  Cardiovascular:     Rate and Rhythm: Normal rate and regular rhythm.     Heart sounds: Normal heart sounds. No murmur heard.   No friction rub. No gallop.  Pulmonary:     Effort: Pulmonary effort is normal. No respiratory distress.     Breath sounds: Normal breath sounds. No stridor. No wheezing, rhonchi or rales.  Abdominal:     General: Abdomen is flat. There is no distension.     Palpations: Abdomen is soft. There is no mass.     Tenderness: There is no abdominal tenderness. There is no  guarding or rebound.     Hernia: A hernia is present.     Comments: Reducible left inguinal hernia found.  Genitourinary:    Testes: Normal.  Skin:    General: Skin is warm and dry.  Neurological:     Mental Status: He is alert and oriented to person, place, and time.  Primary care notes reviewed  Assessment  Left inguinal hernia Plan  I discussed the risks and benefits of performing a left inguinal herniorrhaphy with mesh with the patient.  He currently has some anxiety about undergoing general anesthesia, but he will think about the surgery and let us know if he wants to proceed.  The risk of incarceration is low but I did explain to the patient how to reduce the hernia.

## 2021-03-13 NOTE — Patient Instructions (Signed)
Terrence Swanson  03/13/2021     @PREFPERIOPPHARMACY @   Your procedure is scheduled on 03/18/2021.   Report to 03/20/2021 at  0920 A.M.   Call this number if you have problems the morning of surgery:  [phone]   Remember:  Do not eat or drink after midnight.      Take these medicines the morning of surgery with A SIP OF WATER                                        protonix, zoloft.     Do not wear jewelry, make-up or nail polish.  Do not wear lotions, powders, or perfumes, or deodorant.  Do not shave 48 hours prior to surgery.  Men may shave face and neck.  Do not bring valuables to the hospital.  [facility] is not responsible for any belongings or valuables.  Contacts, dentures or bridgework may not be worn into surgery.  Leave your suitcase in the car.  After surgery it may be brought to your room.  For patients admitted to the hospital, discharge time will be determined by your treatment team.  Patients discharged the day of surgery will not be allowed to drive home and must have someone else with them for 24 hours.    Special instructions:  DO NOT smoke tobacco or vape for 24 hours before your procedure.  Please read over the following fact sheets that you were given. Coughing and Deep Breathing, Surgical Site Infection Prevention, Anesthesia Post-op Instructions, and Care and Recovery After Surgery      General Anesthesia, Adult, Care After This sheet gives you information about how to care for yourself after your procedure. Your health care provider may also give you more specific instructions. If you have problems or questions, contact your health care provider. What can I expect after the procedure? After the procedure, the following side effects are common: Pain or discomfort at the IV site. Nausea. Vomiting. Sore throat. Trouble concentrating. Feeling cold or chills. Feeling weak or tired. Sleepiness and fatigue. Soreness and body aches.  These side effects can affect parts of the body that were not involved in surgery. Follow these instructions at home: For the time period you were told by your health care provider:  Rest. Do not participate in activities where you could fall or become injured. Do not drive or use machinery. Do not drink alcohol. Do not take sleeping pills or medicines that cause drowsiness. Do not make important decisions or sign legal documents. Do not take care of children on your own. Eating and drinking Follow any instructions from your health care provider about eating or drinking restrictions. When you feel hungry, start by eating small amounts of foods that are soft and easy to digest (bland), such as toast. Gradually return to your regular diet. Drink enough fluid to keep your urine pale yellow. If you vomit, rehydrate by drinking water, juice, or clear broth. General instructions If you have sleep apnea, surgery and certain medicines can increase your risk for breathing problems. Follow instructions from your health care provider about wearing your sleep device: Anytime you are sleeping, including during daytime naps. While taking prescription pain medicines, sleeping medicines, or medicines that make you drowsy. Have a responsible adult stay with you for the time you are told. It is important to have someone help  care for you until you are awake and alert. Return to your normal activities as told by your health care provider. Ask your health care provider what activities are safe for you. Take over-the-counter and prescription medicines only as told by your health care provider. If you smoke, do not smoke without supervision. Keep all follow-up visits as told by your health care provider. This is important. Contact a health care provider if: You have nausea or vomiting that does not get better with medicine. You cannot eat or drink without vomiting. You have pain that does not get better with  medicine. You are unable to pass urine. You develop a skin rash. You have a fever. You have redness around your IV site that gets worse. Get help right away if: You have difficulty breathing. You have chest pain. You have blood in your urine or stool, or you vomit blood. Summary After the procedure, it is common to have a sore throat or nausea. It is also common to feel tired. Have a responsible adult stay with you for the time you are told. It is important to have someone help care for you until you are awake and alert. When you feel hungry, start by eating small amounts of foods that are soft and easy to digest (bland), such as toast. Gradually return to your regular diet. Drink enough fluid to keep your urine pale yellow. Return to your normal activities as told by your health care provider. Ask your health care provider what activities are safe for you. This information is not intended to replace advice given to you by your health care provider. Make sure you discuss any questions you have with your health care provider. Document Revised: 11/29/2019 Document Reviewed: 06/28/2019 Elsevier Patient Education  2022 [employer] How to Use Chlorhexidine for Bathing Chlorhexidine gluconate (CHG) is a germ-killing (antiseptic) solution that is used to clean the skin. It can get rid of the bacteria that normally live on the skin and can keep them away for about 24 hours. To clean your skin with CHG, you may be given: A CHG solution to use in the shower or as part of a sponge bath. A prepackaged cloth that contains CHG. Cleaning your skin with CHG may help lower the risk for infection: While you are staying in the intensive care unit of the hospital. If you have a vascular access, such as a central line, to provide short-term or long-term access to your veins. If you have a catheter to drain urine from your bladder. If you are on a ventilator. A ventilator is a machine that helps you breathe  by moving air in and out of your lungs. After surgery. What are the risks? Risks of using CHG include: A skin reaction. Hearing loss, if CHG gets in your ears and you have a perforated eardrum. Eye injury, if CHG gets in your eyes and is not rinsed out. The CHG product catching fire. Make sure that you avoid smoking and flames after applying CHG to your skin. Do not use CHG: If you have a chlorhexidine allergy or have previously reacted to chlorhexidine. On babies younger than 15 months of age. How to use CHG solution Use CHG only as told by your health care provider, and follow the instructions on the label. Use the full amount of CHG as directed. Usually, this is one bottle. During a shower Follow these steps when using CHG solution during a shower (unless your health care provider gives you different instructions):  Start the shower. Use your normal soap and shampoo to wash your face and hair. Turn off the shower or move out of the shower stream. Pour the CHG onto a clean washcloth. Do not use any type of brush or rough-edged sponge. Starting at your neck, lather your body down to your toes. Make sure you follow these instructions: If you will be having surgery, pay special attention to the part of your body where you will be having surgery. Scrub this area for at least 1 minute. Do not use CHG on your head or face. If the solution gets into your ears or eyes, rinse them well with water. Avoid your genital area. Avoid any areas of skin that have broken skin, cuts, or scrapes. Scrub your back and under your arms. Make sure to wash skin folds. Let the lather sit on your skin for 1-2 minutes or as long as told by your health care provider. Thoroughly rinse your entire body in the shower. Make sure that all body creases and crevices are rinsed well. Dry off with a clean towel. Do not put any substances on your body afterward--such as powder, lotion, or perfume--unless you are told to do so  by your health care provider. Only use lotions that are recommended by the manufacturer. Put on clean clothes or pajamas. If it is the night before your surgery, sleep in clean sheets.  During a sponge bath Follow these steps when using CHG solution during a sponge bath (unless your health care provider gives you different instructions): Use your normal soap and shampoo to wash your face and hair. Pour the CHG onto a clean washcloth. Starting at your neck, lather your body down to your toes. Make sure you follow these instructions: If you will be having surgery, pay special attention to the part of your body where you will be having surgery. Scrub this area for at least 1 minute. Do not use CHG on your head or face. If the solution gets into your ears or eyes, rinse them well with water. Avoid your genital area. Avoid any areas of skin that have broken skin, cuts, or scrapes. Scrub your back and under your arms. Make sure to wash skin folds. Let the lather sit on your skin for 1-2 minutes or as long as told by your health care provider. Using a different clean, wet washcloth, thoroughly rinse your entire body. Make sure that all body creases and crevices are rinsed well. Dry off with a clean towel. Do not put any substances on your body afterward--such as powder, lotion, or perfume--unless you are told to do so by your health care provider. Only use lotions that are recommended by the manufacturer. Put on clean clothes or pajamas. If it is the night before your surgery, sleep in clean sheets. How to use CHG prepackaged cloths Only use CHG cloths as told by your health care provider, and follow the instructions on the label. Use the CHG cloth on clean, dry skin. Do not use the CHG cloth on your head or face unless your health care provider tells you to. When washing with the CHG cloth: Avoid your genital area. Avoid any areas of skin that have broken skin, cuts, or scrapes. Before  surgery Follow these steps when using a CHG cloth to clean before surgery (unless your health care provider gives you different instructions): Using the CHG cloth, vigorously scrub the part of your body where you will be having surgery. Scrub using a back-and-forth motion  for 3 minutes. The area on your body should be completely wet with CHG when you are done scrubbing. Do not rinse. Discard the cloth and let the area air-dry. Do not put any substances on the area afterward, such as powder, lotion, or perfume. Put on clean clothes or pajamas. If it is the night before your surgery, sleep in clean sheets.  For general bathing Follow these steps when using CHG cloths for general bathing (unless your health care provider gives you different instructions). Use a separate CHG cloth for each area of your body. Make sure you wash between any folds of skin and between your fingers and toes. Wash your body in the following order, switching to a new cloth after each step: The front of your neck, shoulders, and chest. Both of your arms, under your arms, and your hands. Your stomach and groin area, avoiding the genitals. Your right leg and foot. Your left leg and foot. The back of your neck, your back, and your buttocks. Do not rinse. Discard the cloth and let the area air-dry. Do not put any substances on your body afterward--such as powder, lotion, or perfume--unless you are told to do so by your health care provider. Only use lotions that are recommended by the manufacturer. Put on clean clothes or pajamas. Contact a health care provider if: Your skin gets irritated after scrubbing. You have questions about using your solution or cloth. You swallow any chlorhexidine. Call your local poison control center ([phone] in the [place] Get help right away if: Your eyes itch badly, or they become very red or swollen. Your skin itches badly and is red or swollen. Your hearing changes. You have trouble  seeing. You have swelling or tingling in your mouth or throat. You have trouble breathing. These symptoms may represent a serious problem that is an emergency. Do not wait to see if the symptoms will go away. Get medical help right away. Call your local emergency services (911 in the [place] Do not drive yourself to the hospital. Summary Chlorhexidine gluconate (CHG) is a germ-killing (antiseptic) solution that is used to clean the skin. Cleaning your skin with CHG may help to lower your risk for infection. You may be given CHG to use for bathing. It may be in a bottle or in a prepackaged cloth to use on your skin. Carefully follow your health care provider's instructions and the instructions on the product label. Do not use CHG if you have a chlorhexidine allergy. Contact your health care provider if your skin gets irritated after scrubbing. This information is not intended to replace advice given to you by your health care provider. Make sure you discuss any questions you have with your health care provider. Document Revised: 05/26/2020 Document Reviewed: 05/26/2020 Elsevier Patient Education  2022 [employer].

## 2021-03-16 ENCOUNTER — Encounter (HOSPITAL_COMMUNITY): Payer: Self-pay

## 2021-03-16 ENCOUNTER — Other Ambulatory Visit: Payer: Self-pay

## 2021-03-16 ENCOUNTER — Encounter (HOSPITAL_COMMUNITY)
Admission: RE | Admit: 2021-03-16 | Discharge: 2021-03-16 | Disposition: A | Discharge status: Home / Self Care | Payer: Medicaid Other | Source: Ambulatory Visit | Attending: General Surgery | Admitting: General Surgery

## 2021-03-16 DIAGNOSIS — Z01810 Encounter for preprocedural cardiovascular examination: Secondary | ICD-10-CM | POA: Diagnosis present

## 2021-03-16 HISTORY — DX: Superior glenoid labrum lesion of unspecified shoulder, initial encounter: S43.439A

## 2021-03-16 HISTORY — DX: Other complications of anesthesia, initial encounter: T88.59XA

## 2021-03-16 HISTORY — DX: Unspecified asthma, uncomplicated: J45.909

## 2021-03-16 HISTORY — DX: Gastro-esophageal reflux disease without esophagitis: K21.9

## 2021-03-16 HISTORY — DX: Peripheral vascular disease, unspecified: I73.9

## 2021-03-16 HISTORY — DX: Chronic obstructive pulmonary disease, unspecified: J44.9

## 2021-03-16 HISTORY — DX: Other intervertebral disc displacement, lumbar region: M51.26

## 2021-03-16 HISTORY — DX: Diverticulosis of intestine, part unspecified, without perforation or abscess without bleeding: K57.90

## 2021-03-18 ENCOUNTER — Other Ambulatory Visit: Payer: Self-pay

## 2021-03-18 ENCOUNTER — Ambulatory Visit (HOSPITAL_COMMUNITY): Payer: Medicaid Other | Admitting: Anesthesiology

## 2021-03-18 ENCOUNTER — Ambulatory Visit (HOSPITAL_COMMUNITY)
Admission: RE | Admit: 2021-03-18 | Discharge: 2021-03-18 | Disposition: A | Discharge status: Home / Self Care | Payer: Medicaid Other | Source: Ambulatory Visit | Attending: General Surgery | Admitting: General Surgery

## 2021-03-18 ENCOUNTER — Encounter (HOSPITAL_COMMUNITY): Payer: Self-pay | Admitting: General Surgery

## 2021-03-18 ENCOUNTER — Encounter (HOSPITAL_COMMUNITY)
Admission: RE | Disposition: A | Discharge status: Home / Self Care | Payer: Self-pay | Source: Ambulatory Visit | Attending: General Surgery

## 2021-03-18 DIAGNOSIS — K4090 Unilateral inguinal hernia, without obstruction or gangrene, not specified as recurrent: Secondary | ICD-10-CM | POA: Diagnosis present

## 2021-03-18 HISTORY — PX: INGUINAL HERNIA REPAIR: SHX194

## 2021-03-18 SURGERY — REPAIR, HERNIA, INGUINAL, ADULT
Anesthesia: General | Site: Inguinal | Laterality: Left

## 2021-03-18 MED ORDER — LIDOCAINE HCL (CARDIAC) PF 100 MG/5ML IV SOSY
PREFILLED_SYRINGE | INTRAVENOUS | Status: DC | PRN
  Administered 2021-03-18: 50 mg via INTRAVENOUS

## 2021-03-18 MED ORDER — BUPIVACAINE HCL (300 MG DOSE) 3 X 100 MG IL IMPL
DRUG_IMPLANT | Status: AC
  Filled 2021-03-18: qty 100

## 2021-03-18 MED ORDER — ORAL CARE MOUTH RINSE: 15.0000 mL | Freq: Once | OROMUCOSAL | Status: DC

## 2021-03-18 MED ORDER — FENTANYL CITRATE (PF) 100 MCG/2ML IJ SOLN
INTRAMUSCULAR | Status: AC
  Filled 2021-03-18: qty 2

## 2021-03-18 MED ORDER — GLYCOPYRROLATE 0.2 MG/ML IJ SOLN
INTRAMUSCULAR | Status: DC | PRN
  Administered 2021-03-18: .2 mg via INTRAVENOUS

## 2021-03-18 MED ORDER — MEPERIDINE HCL 50 MG/ML IJ SOLN: 6.2500 mg | INTRAMUSCULAR | Status: DC | PRN

## 2021-03-18 MED ORDER — HYDROMORPHONE HCL 1 MG/ML IJ SOLN: 0.2500 mg | INTRAMUSCULAR | Status: DC | PRN

## 2021-03-18 MED ORDER — EPHEDRINE 5 MG/ML INJ
INTRAVENOUS | Status: AC
  Filled 2021-03-18: qty 10

## 2021-03-18 MED ORDER — FENTANYL CITRATE (PF) 100 MCG/2ML IJ SOLN
INTRAMUSCULAR | Status: DC | PRN
  Administered 2021-03-18 (×2): 100 ug via INTRAVENOUS

## 2021-03-18 MED ORDER — DEXAMETHASONE SODIUM PHOSPHATE 10 MG/ML IJ SOLN
INTRAMUSCULAR | Status: DC | PRN
  Administered 2021-03-18: 10 mg via INTRAVENOUS

## 2021-03-18 MED ORDER — BUPIVACAINE HCL (300 MG DOSE) 3 X 100 MG IL IMPL
DRUG_IMPLANT | Status: DC | PRN
  Administered 2021-03-18: 300 mg

## 2021-03-18 MED ORDER — MIDAZOLAM HCL 2 MG/2ML IJ SOLN
INTRAMUSCULAR | Status: AC
  Filled 2021-03-18: qty 2

## 2021-03-18 MED ORDER — CHLORHEXIDINE GLUCONATE CLOTH 2 % EX PADS: 6.0000 | MEDICATED_PAD | Freq: Once | CUTANEOUS | Status: DC

## 2021-03-18 MED ORDER — MIDAZOLAM HCL 5 MG/5ML IJ SOLN
INTRAMUSCULAR | Status: DC | PRN
  Administered 2021-03-18: 2 mg via INTRAVENOUS

## 2021-03-18 MED ORDER — CEFAZOLIN SODIUM-DEXTROSE 2-4 GM/100ML-% IV SOLN
INTRAVENOUS | Status: AC
  Filled 2021-03-18: qty 100

## 2021-03-18 MED ORDER — ONDANSETRON HCL 4 MG/2ML IJ SOLN: 4.0000 mg | Freq: Once | INTRAMUSCULAR | Status: DC | PRN

## 2021-03-18 MED ORDER — PHENYLEPHRINE 40 MCG/ML (10ML) SYRINGE FOR IV PUSH (FOR BLOOD PRESSURE SUPPORT)
PREFILLED_SYRINGE | INTRAVENOUS | Status: AC
  Filled 2021-03-18: qty 10

## 2021-03-18 MED ORDER — LIDOCAINE HCL (PF) 2 % IJ SOLN
INTRAMUSCULAR | Status: AC
  Filled 2021-03-18: qty 5

## 2021-03-18 MED ORDER — CEFAZOLIN SODIUM-DEXTROSE 2-4 GM/100ML-% IV SOLN
2.0000 g | INTRAVENOUS | Status: AC
  Administered 2021-03-18: 11:00:00 2 g via INTRAVENOUS

## 2021-03-18 MED ORDER — ONDANSETRON HCL 4 MG/2ML IJ SOLN
INTRAMUSCULAR | Status: DC | PRN
  Administered 2021-03-18: 4 mg via INTRAVENOUS

## 2021-03-18 MED ORDER — CHLORHEXIDINE GLUCONATE 0.12 % MT SOLN: 15.0000 mL | Freq: Once | OROMUCOSAL | Status: DC

## 2021-03-18 MED ORDER — ONDANSETRON HCL 4 MG/2ML IJ SOLN
INTRAMUSCULAR | Status: AC
  Filled 2021-03-18: qty 2

## 2021-03-18 MED ORDER — LACTATED RINGERS IV SOLN: INTRAVENOUS | Status: DC

## 2021-03-18 MED ORDER — SODIUM CHLORIDE 0.9 % IR SOLN
Status: DC | PRN
  Administered 2021-03-18: 1000 mL

## 2021-03-18 MED ORDER — PROPOFOL 10 MG/ML IV BOLUS
INTRAVENOUS | Status: AC
  Filled 2021-03-18: qty 20

## 2021-03-18 MED ORDER — DEXAMETHASONE SODIUM PHOSPHATE 10 MG/ML IJ SOLN
INTRAMUSCULAR | Status: AC
  Filled 2021-03-18: qty 1

## 2021-03-18 MED ORDER — PROPOFOL 10 MG/ML IV BOLUS
INTRAVENOUS | Status: DC | PRN
  Administered 2021-03-18: 200 mg via INTRAVENOUS

## 2021-03-18 MED ORDER — HYDROCODONE-ACETAMINOPHEN 5-325 MG PO TABS: 1.0000 | ORAL_TABLET | ORAL | 0 refills | Status: DC | PRN

## 2021-03-18 MED ORDER — EPHEDRINE SULFATE 50 MG/ML IJ SOLN
INTRAMUSCULAR | Status: DC | PRN
  Administered 2021-03-18: 5 mg via INTRAVENOUS
  Administered 2021-03-18 (×2): 10 mg via INTRAVENOUS

## 2021-03-18 MED ORDER — KETOROLAC TROMETHAMINE 30 MG/ML IJ SOLN: 30.0000 mg | Freq: Once | INTRAMUSCULAR | Status: DC

## 2021-03-18 SURGICAL SUPPLY — 32 items
ADH SKN CLS APL DERMABOND .7 (GAUZE/BANDAGES/DRESSINGS) ×1
CLOTH BEACON ORANGE TIMEOUT ST (SAFETY) ×3 IMPLANT
COVER LIGHT HANDLE STERIS (MISCELLANEOUS) ×6 IMPLANT
DERMABOND ADVANCED (GAUZE/BANDAGES/DRESSINGS) ×2
DERMABOND ADVANCED .7 DNX12 (GAUZE/BANDAGES/DRESSINGS) ×1 IMPLANT
DRAIN PENROSE 0.5X18 (DRAIN) ×3 IMPLANT
ELECT REM PT RETURN 9FT ADLT (ELECTROSURGICAL) ×3
ELECTRODE REM PT RTRN 9FT ADLT (ELECTROSURGICAL) ×1 IMPLANT
GAUZE 4X4 16PLY ~~LOC~~+RFID DBL (SPONGE) ×3 IMPLANT
GAUZE SPONGE 4X4 12PLY STRL (GAUZE/BANDAGES/DRESSINGS) ×3 IMPLANT
GLOVE SURG POLYISO LF SZ6.5 (GLOVE) ×2 IMPLANT
GLOVE SURG POLYISO LF SZ7.5 (GLOVE) ×3 IMPLANT
GLOVE SURG UNDER POLY LF SZ7 (GLOVE) ×9 IMPLANT
GOWN STRL REUS W/TWL LRG LVL3 (GOWN DISPOSABLE) ×9 IMPLANT
INST SET MINOR GENERAL (KITS) ×3 IMPLANT
KIT TURNOVER KIT A (KITS) ×3 IMPLANT
MANIFOLD NEPTUNE II (INSTRUMENTS) ×3 IMPLANT
MESH MARLEX PLUG MEDIUM (Mesh General) ×2 IMPLANT
NS IRRIG 1000ML POUR BTL (IV SOLUTION) ×3 IMPLANT
PACK MINOR (CUSTOM PROCEDURE TRAY) ×3 IMPLANT
PAD ARMBOARD 7.5X6 YLW CONV (MISCELLANEOUS) ×3 IMPLANT
SET BASIN LINEN APH (SET/KITS/TRAYS/PACK) ×3 IMPLANT
SOL PREP PROV IODINE SCRUB 4OZ (MISCELLANEOUS) ×3 IMPLANT
SUT MNCRL AB 4-0 PS2 18 (SUTURE) ×3 IMPLANT
SUT NOVA NAB GS-22 2 2-0 T-19 (SUTURE) ×8 IMPLANT
SUT SILK 3 0 (SUTURE)
SUT SILK 3-0 18XBRD TIE 12 (SUTURE) IMPLANT
SUT VIC AB 2-0 CT1 27 (SUTURE) ×3
SUT VIC AB 2-0 CT1 TAPERPNT 27 (SUTURE) ×1 IMPLANT
SUT VIC AB 3-0 SH 27 (SUTURE) ×3
SUT VIC AB 3-0 SH 27X BRD (SUTURE) ×1 IMPLANT
SUT VICRYL AB 2 0 TIES (SUTURE) ×2 IMPLANT

## 2021-03-18 NOTE — Anesthesia Procedure Notes (Signed)
Procedure Name: LMA Insertion Date/Time: 03/18/2021 11:00 AM Performed by: Shanon Payor, CRNA Pre-anesthesia Checklist: Patient identified, Emergency Drugs available, Suction available, Patient being monitored and Timeout performed Patient Re-evaluated:Patient Re-evaluated prior to induction Oxygen Delivery Method: Circle system utilized Preoxygenation: Pre-oxygenation with 100% oxygen Induction Type: IV induction LMA: LMA inserted LMA Size: 4.0 Number of attempts: 1 Placement Confirmation: positive ETCO2 and breath sounds checked- equal and bilateral Tube secured with: Tape Dental Injury: Teeth and Oropharynx as per pre-operative assessment

## 2021-03-18 NOTE — Interval H&P Note (Signed)
History and Physical Interval Note:  03/18/2021 10:04 AM  Terrence Swanson  has presented today for surgery, with the diagnosis of Left inguinal hernia.  The various methods of treatment have been discussed with the patient and family. After consideration of risks, benefits and other options for treatment, the patient has consented to  Procedure(s): HERNIA REPAIR INGUINAL ADULT W/MESH (Left) as a surgical intervention.  The patient's history has been reviewed, patient examined, no change in status, stable for surgery.  I have reviewed the patient's chart and labs.  Questions were answered to the patient's satisfaction.     Franky Macho

## 2021-03-18 NOTE — Anesthesia Preprocedure Evaluation (Signed)
Anesthesia Evaluation  Patient identified by MRN, date of birth, ID band Patient awake    Reviewed: Allergy & Precautions, NPO status , Patient's Chart, lab work & pertinent test results  History of Anesthesia Complications (+) PROLONGED EMERGENCE and history of anesthetic complications  Airway Mallampati: III  TM Distance: >3 FB Neck ROM: Full    Dental  (+) Dental Advisory Given Crowns :   Pulmonary asthma , COPD, former smoker,    Pulmonary exam normal breath sounds clear to auscultation       Cardiovascular Exercise Tolerance: Good + Peripheral Vascular Disease  Normal cardiovascular exam Rhythm:Regular Rate:Normal  07/27/19 cath ? The left ventricular systolic function is normal. ? LV end diastolic pressure is normal. ? The left ventricular ejection fraction is 55-65% by visual estimate. ? 2nd Diag lesion is 20% stenosed. ? Prox RCA lesion is 10% stenosed.   1.  Left dominant coronary arteries with minimal irregularities and no evidence of obstructive coronary artery disease 2.  Normal LV systolic function and normal left ventricular end-diastolic pressure.  Recommendations: False-positive nuclear stress test. Chest pain seems to be noncardiac.    Neuro/Psych PSYCHIATRIC DISORDERS Anxiety Depression negative neurological ROS     GI/Hepatic Neg liver ROS, GERD  Medicated and Controlled,  Endo/Other  negative endocrine ROS  Renal/GU negative Renal ROS  negative genitourinary   Musculoskeletal negative musculoskeletal ROS (+)   Abdominal   Peds negative pediatric ROS (+)  Hematology negative hematology ROS (+)   Anesthesia Other Findings   Reproductive/Obstetrics negative OB ROS                             Anesthesia Physical Anesthesia Plan  ASA: 2  Anesthesia Plan: General   Post-op Pain Management: Dilaudid IV   Induction: Intravenous  PONV Risk Score and Plan: 3  and Ondansetron and Dexamethasone  Airway Management Planned: Oral ETT and LMA  Additional Equipment:   Intra-op Plan:   Post-operative Plan: Extubation in OR  Informed Consent: I have reviewed the patients History and Physical, chart, labs and discussed the procedure including the risks, benefits and alternatives for the proposed anesthesia with the patient or authorized representative who has indicated his/her understanding and acceptance.     Dental advisory given  Plan Discussed with: CRNA and Surgeon  Anesthesia Plan Comments:         Anesthesia Quick Evaluation

## 2021-03-18 NOTE — Anesthesia Postprocedure Evaluation (Signed)
Anesthesia Post Note  Patient: Terrence Swanson  Procedure(s) Performed: LEFT INGUINAL HERNIA REAPAIR W/ MESH (ADULT) (Left: Inguinal)  Patient location during evaluation: Phase II Anesthesia Type: General Level of consciousness: awake and alert and oriented Pain management: pain level controlled Vital Signs Assessment: post-procedure vital signs reviewed and stable Respiratory status: spontaneous breathing, nonlabored ventilation and respiratory function stable Cardiovascular status: blood pressure returned to baseline and stable Postop Assessment: no apparent nausea or vomiting Anesthetic complications: no   No notable events documented.   Last Vitals:  Vitals:   03/18/21 1231 03/18/21 1243  BP:  115/71  Pulse:  69  Resp:  14  Temp:  36.4 C  SpO2: 96% 96%    Last Pain:  Vitals:   03/18/21 1243  TempSrc: Oral  PainSc: 1                  Seyon Strader C Kymber Kosar

## 2021-03-18 NOTE — Transfer of Care (Signed)
Immediate Anesthesia Transfer of Care Note  Patient: Terrence Swanson  Procedure(s) Performed: LEFT INGUINAL HERNIA REAPAIR W/ MESH (ADULT) (Left: Inguinal)  Patient Location: PACU  Anesthesia Type:General  Level of Consciousness: awake, alert , oriented and patient cooperative  Airway & Oxygen Therapy: Patient Spontanous Breathing  Post-op Assessment: Report given to RN, Post -op Vital signs reviewed and stable and Patient moving all extremities X 4  Post vital signs: Reviewed and stable  Last Vitals:  Vitals Value Taken Time  BP 112/69 03/18/21 1230  Temp 36.7 C 03/18/21 1206  Pulse 76 03/18/21 1233  Resp 20 03/18/21 1233  SpO2 98 % 03/18/21 1233  Vitals shown include unvalidated device data.  Last Pain:  Vitals:   03/18/21 1206  TempSrc:   PainSc: Asleep         Complications: No notable events documented.

## 2021-03-18 NOTE — Op Note (Signed)
Patient:  Terrence Swanson  DOB:  1960/05/11  MRN:  [id number]   Preop Diagnosis: Left inguinal hernia  Postop Diagnosis: Same  Procedure: Left inguinal herniorrhaphy with mesh  Surgeon: Franky Macho, MD  Asst:  Katie Pappayliou, DO  Anes: General  Indications: Patient is a 60 year old white male who presents with a symptomatic left inguinal hernia.  The risks and benefits of the procedure including bleeding, infection, mesh use, and the possibility of recurrence of the hernia were fully explained to the patient, who gave informed consent.  Procedure note: The patient was placed in the supine position.  After general anesthesia was administered, the left groin region was prepped and draped using the usual sterile technique with Betadine.  Surgical site confirmation was performed.  Incision was made in the left groin region down to the external oblique aponeurosis.  The aponeurosis was incised to the external ring.  A Penrose drain was placed around the spermatic cord.  The vas deferens was noted within the spermatic cord.  The ilioinguinal nerve was identified and retracted superiorly from the operative field.  The patient had a large lipoma of the cord as well as an indirect hernia sac.  The lipoma of the cord was freed away from the spermatic cord and a high ligation was performed using 3-0 Vicryl ties.  The hernia sac was freed away from the spermatic cord up to the peritoneal reflection and inverted.  A medium size Bard PerFix plug was then placed.  An onlay patch was placed along the floor of the inguinal canal and secured superiorly to the conjoined tendon and inferiorly to the shelving edge of Poupart's ligament using 2-0 Novafil interrupted sutures.  The internal ring was recreated using a 2-0 Novafil interrupted suture.  [name] was placed over the mesh and in the subcutaneous tissue.  The external oblique aponeurosis was reapproximated using a 2-0 Vicryl running suture.  Subcutaneous layer  was reapproximated using 3-0 Vicryl interrupted suture.  The skin was closed using a 4-0 Monocryl subcuticular suture.  Dermabond was applied.  All tape and needle counts were correct at the end of the procedure.  Patient was awakened and transferred to PACU in stable condition.  Complications: None  EBL: Minimal  Specimen: None

## 2021-03-19 ENCOUNTER — Encounter (HOSPITAL_COMMUNITY): Payer: Self-pay | Admitting: General Surgery

## 2021-03-24 ENCOUNTER — Other Ambulatory Visit (INDEPENDENT_AMBULATORY_CARE_PROVIDER_SITE_OTHER): Payer: Medicaid Other | Admitting: General Surgery

## 2021-03-24 DIAGNOSIS — Z09 Encounter for follow-up examination after completed treatment for conditions other than malignant neoplasm: Secondary | ICD-10-CM

## 2021-03-24 MED ORDER — OXYCODONE-ACETAMINOPHEN 10-325 MG PO TABS: 1.0000 | ORAL_TABLET | Freq: Four times a day (QID) | ORAL | 0 refills | Status: DC | PRN

## 2021-03-24 NOTE — Progress Notes (Signed)
Change in therapy to percocet

## 2021-03-25 ENCOUNTER — Telehealth: Payer: Self-pay | Admitting: *Deleted

## 2021-03-25 NOTE — Telephone Encounter (Signed)
Late documentation for 03/24/2021:  Received call from patient over holiday weekend while office was closed.   Surgical Date: 03/18/2021 Procedure: L Inguinal Hernia Repair  Pain Management: APAP 650mg  Q 6Hr PRN, Hydrocodone APAP  Pharmacy:  Concerns: Patient reports increased pain and feels that Hydrocodone/APAP is not effective. States that he feels that APAP is more effective, but he continues to have increased pain.   Reports soreness in throat over weekend and cough that has resolved. Reports that he was intubated and feels that this is what caused cough. States that he is using pillow to brace during cough, but pain is intense.   Discussed with provider and provider contacted patient for evaluation. New prescription sent to pharmacy for Oxycodone/APAP.   Received fax from pharmacy requesting PA for medication as insurance only covers 5 day supply. Per provider, ok to decrease quantity to 5 day supply.   Call placed to pharmacy and given provider directions.

## 2021-04-03 ENCOUNTER — Telehealth (INDEPENDENT_AMBULATORY_CARE_PROVIDER_SITE_OTHER): Payer: Medicaid Other | Admitting: General Surgery

## 2021-04-03 DIAGNOSIS — Z09 Encounter for follow-up examination after completed treatment for conditions other than malignant neoplasm: Secondary | ICD-10-CM

## 2021-04-03 NOTE — Telephone Encounter (Signed)
Virtual telephone postoperative visit performed with patient.  He states he is doing much better.  The second round of pain medication has significantly helped.  He has not taking them regularly now.  He is increasing his activity as able.  I told him to call me should any problems arise.  As this was a part of the total global surgical fee, this was not a billable visit.  Total telephone time was 1-1/2 minutes.

## 2021-04-06 ENCOUNTER — Telehealth: Payer: Self-pay | Admitting: *Deleted

## 2021-04-06 NOTE — Telephone Encounter (Signed)
Received call from patient   Surgical Date: 03/18/2021 Procedure: L Inguinal Hernia  Concerns:  Patient reports that he is having bulging under incision. States that he is having no pain unless he stretched incision area. States that skin is warm and dry, and is not red or hot to touch.   Also reports that L scrotum is larger than R testicle and would like MD to evaluate. Appointment scheduled.

## 2021-04-07 ENCOUNTER — Ambulatory Visit (INDEPENDENT_AMBULATORY_CARE_PROVIDER_SITE_OTHER): Payer: Medicaid Other | Admitting: General Surgery

## 2021-04-07 ENCOUNTER — Other Ambulatory Visit: Payer: Self-pay

## 2021-04-07 ENCOUNTER — Encounter: Payer: Self-pay | Admitting: General Surgery

## 2021-04-07 VITALS — BP 121/71 | HR 68 | Temp 97.6°F | Resp 16 | Ht 72.0 in | Wt 179.0 lb

## 2021-04-07 DIAGNOSIS — Z09 Encounter for follow-up examination after completed treatment for conditions other than malignant neoplasm: Secondary | ICD-10-CM

## 2021-04-07 NOTE — Progress Notes (Signed)
Subjective:     Terrence Swanson  Here for follow-up, status post left inguinal herniorrhaphy with mesh.  Patient had some concerns about his incision.  He feels increased density along the incision.  He still has some mild left testicular discomfort. Objective:    BP 121/71    Pulse 68    Temp 97.6 F (36.4 C) (Other (Comment))    Resp 16    Ht 6' (1.829 m)    Wt 179 lb (81.2 kg)    SpO2 95%    BMI 24.28 kg/m   General:  alert, cooperative, and no distress  Left inguinal incision is healing well.  No seroma or hematoma is present.  No swelling is present.  He does feel the subcutaneous healing ridge which is normal.     Assessment:    Doing well postoperatively.    Normal postoperative course. Plan:   I reassured the patient that he was doing well.  I told him to follow-up here should he have any further questions.  May increase activity as able.

## 2022-05-25 LAB — LAB REPORT - SCANNED: A1c: 5.8

## 2022-06-01 ENCOUNTER — Other Ambulatory Visit (HOSPITAL_COMMUNITY): Payer: Self-pay | Admitting: Family Medicine

## 2022-06-01 DIAGNOSIS — R42 Dizziness and giddiness: Secondary | ICD-10-CM

## 2022-06-10 ENCOUNTER — Ambulatory Visit (HOSPITAL_COMMUNITY)
Admission: RE | Admit: 2022-06-10 | Discharge: 2022-06-10 | Disposition: A | Discharge status: Home / Self Care | Payer: Medicaid Other | Source: Ambulatory Visit | Attending: Family Medicine | Admitting: Family Medicine

## 2022-06-10 DIAGNOSIS — R42 Dizziness and giddiness: Secondary | ICD-10-CM | POA: Diagnosis present

## 2022-10-20 ENCOUNTER — Other Ambulatory Visit (HOSPITAL_COMMUNITY): Payer: Self-pay | Admitting: Family Medicine

## 2022-10-20 DIAGNOSIS — Z87891 Personal history of nicotine dependence: Secondary | ICD-10-CM

## 2022-12-09 ENCOUNTER — Ambulatory Visit (HOSPITAL_COMMUNITY)
Admission: RE | Admit: 2022-12-09 | Discharge: 2022-12-09 | Disposition: A | Discharge status: Home / Self Care | Payer: Medicare Other | Source: Ambulatory Visit | Attending: Family Medicine | Admitting: Family Medicine

## 2022-12-09 DIAGNOSIS — Z87891 Personal history of nicotine dependence: Secondary | ICD-10-CM | POA: Insufficient documentation

## 2023-01-04 ENCOUNTER — Other Ambulatory Visit (HOSPITAL_COMMUNITY): Payer: Self-pay | Admitting: Family Medicine

## 2023-01-04 DIAGNOSIS — R911 Solitary pulmonary nodule: Secondary | ICD-10-CM

## 2023-01-20 ENCOUNTER — Ambulatory Visit (HOSPITAL_COMMUNITY)
Admission: RE | Admit: 2023-01-20 | Discharge: 2023-01-20 | Disposition: A | Discharge status: Home / Self Care | Payer: Medicare Other | Source: Ambulatory Visit | Attending: Family Medicine | Admitting: Family Medicine

## 2023-01-20 DIAGNOSIS — R911 Solitary pulmonary nodule: Secondary | ICD-10-CM | POA: Diagnosis present

## 2023-01-20 MED ORDER — FLUDEOXYGLUCOSE F - 18 (FDG) INJECTION
10.3000 | Freq: Once | INTRAVENOUS | Status: AC | PRN
  Administered 2023-01-20: 8.94 via INTRAVENOUS

## 2023-02-02 ENCOUNTER — Encounter: Payer: Medicare Other | Admitting: Thoracic Surgery (Cardiothoracic Vascular Surgery)

## 2023-02-17 ENCOUNTER — Encounter: Payer: Self-pay | Admitting: Thoracic Surgery (Cardiothoracic Vascular Surgery)

## 2023-02-17 ENCOUNTER — Other Ambulatory Visit: Payer: Self-pay | Admitting: Thoracic Surgery (Cardiothoracic Vascular Surgery)

## 2023-02-17 ENCOUNTER — Institutional Professional Consult (permissible substitution): Payer: Medicare Other | Admitting: Thoracic Surgery (Cardiothoracic Vascular Surgery)

## 2023-02-17 VITALS — BP 134/78 | HR 64 | Resp 20 | Ht 71.0 in | Wt 180.0 lb

## 2023-02-17 DIAGNOSIS — R911 Solitary pulmonary nodule: Secondary | ICD-10-CM

## 2023-02-17 NOTE — Progress Notes (Signed)
PCP is Lianne Moris, PA-C Referring Provider is Sasser, Clarene Critchley, MD  Chief Complaint  Patient presents with   Lung Lesion    Surgical consult/ Chest CT 12/09/22/ PET Scan 02/02/23/MR Brain 06/10/22    HPI: [name] sent for consultation regarding right upper and middle lobe lung nodules.  Terrence Swanson is a 62 year old former smoker with a past medical history significant for COPD, asthma, reflux, peripheral vascular disease, iliac stents, anxiety, depression, herniated disc, and diverticulosis.  He smoked about 1.5 packs a day for 45 years prior to quitting about 5 years ago.  He had a low-dose CT for lung cancer screening in September which showed suspicious nodules in the right upper lobe and right middle lobe.  There also was emphysema and probably some pulmonary fibrosis as well.  PET/CT showed both nodules were hypermetabolic.  There was no evidence of mediastinal or hilar adenopathy.  He is retired.  He cares for his wife who has had multiple strokes and requires around-the-clock care.  He has a son who also has some issues who he cares for as well.  He does have other family close by that can potentially help with his care.  He has history of chest pain and dizzy spells.  He said no weight loss.  Does have chronic headaches, unchanged recently.  Occasional cough.  Denies wheezing shortness of breath with routine activities.  Does not exercise.  Zubrod Score: At the time of surgery this patient's most appropriate activity status/level should be described as: []     0    Normal activity, no symptoms [x]     1    Restricted in physical strenuous activity but ambulatory, able to do out light work []     2    Ambulatory and capable of self care, unable to do work activities, up and about >50 % of waking hours                              []     3    Only limited self care, in bed greater than 50% of waking hours []     4    Completely disabled, no self care, confined to bed or chair []     5     Moribund  Past Medical History:  Diagnosis Date   Anxiety    Asthma    Complication of anesthesia    difficult to wake up after anesthesia   COPD (chronic obstructive pulmonary disease) (HCC)    Depression    Diverticulosis    GERD (gastroesophageal reflux disease)    Herniated lumbar intervertebral disc    Labral tear of shoulder    left shoulder   Peripheral vascular disease (HCC)     Past Surgical History:  Procedure Laterality Date   ABDOMINAL AORTAGRAM N/A 07/24/2014   Procedure: ABDOMINAL Ronny Flurry;  Surgeon: Iran Ouch, MD;  Location: [facility];  Service: Cardiovascular;  Laterality: N/A;   ABDOMINAL AORTOGRAM N/A 07/28/2016   Procedure: Abdominal Aortogram;  Surgeon: Iran Ouch, MD;  Location: [facility];  Service: Cardiovascular;  Laterality: N/A;   INGUINAL HERNIA REPAIR Left 03/18/2021   Procedure: LEFT INGUINAL HERNIA REAPAIR W/ MESH (ADULT);  Surgeon: Franky Macho, MD;  Location: [facility];  Service: General;  Laterality: Left;   LEFT HEART CATH AND CORONARY ANGIOGRAPHY N/A 07/27/2019   Procedure: LEFT HEART CATH AND CORONARY ANGIOGRAPHY;  Surgeon: Iran Ouch, MD;  Location: [facility];  Service: Cardiovascular;  Laterality: N/A;   PERCUTANEOUS STENT INTERVENTION N/A 07/24/2014   Procedure: PERCUTANEOUS STENT INTERVENTION;  Surgeon: Iran Ouch, MD;  Location: [facility];  Service: Cardiovascular;  Laterality: N/A;  rt and left common iliacs   PERIPHERAL VASCULAR INTERVENTION Bilateral 07/28/2016   Procedure: Peripheral Vascular Intervention;  Surgeon: Iran Ouch, MD;  Location: [facility];  Service: Cardiovascular;  Laterality: Bilateral;  COMMON ILIACS    Family History  Problem Relation Age of Onset   Diabetes Mother    Cancer Father        Throat    Social History Social History   Tobacco Use   Smoking status: Former    Current packs/day: 0.00    Average packs/day: 1.5 packs/day for 45.0 years (67.5 ttl  pk-yrs)    Types: Cigarettes    Start date: 08/27/1973    Quit date: 08/28/2018    Years since quitting: 4.4   Smokeless tobacco: Never  Vaping Use   Vaping status: Never Used  Substance Use Topics   Alcohol use: No   Drug use: No    Current Outpatient Medications  Medication Sig Dispense Refill   aspirin EC 81 MG tablet Take 1 tablet (81 mg total) by mouth daily. (Patient taking differently: Take 81 mg by mouth at bedtime.)     atorvastatin (LIPITOR) 40 MG tablet Take 1 tablet (40 mg total) by mouth daily. 30 tablet 10   clonazePAM (KLONOPIN) 1 MG tablet Take 1 mg by mouth at bedtime.     hydrocortisone cream 1 % Apply 1 application topically daily as needed for itching.     pantoprazole (PROTONIX) 40 MG tablet Take 40 mg by mouth daily.     No current facility-administered medications for this visit.    No Known Allergies  Review of Systems  Constitutional:  Negative for unexpected weight change.  HENT:  Negative for trouble swallowing and voice change.   Eyes:  Negative for visual disturbance.  Respiratory:  Positive for cough. Negative for shortness of breath and wheezing.   Cardiovascular:  Positive for chest pain.  Genitourinary:  Negative for difficulty urinating and dysuria.  Musculoskeletal:  Positive for arthralgias and joint swelling.  Skin:        Itching  Neurological:  Positive for dizziness and headaches.       Memory issues  Psychiatric/Behavioral:  Positive for dysphoric mood. The patient is nervous/anxious.   All other systems reviewed and are negative.   BP 134/78   Pulse 64   Resp 20   Ht 5\' 11"  (1.803 m)   Wt 180 lb (81.6 kg)   SpO2 96% Comment: RA  BMI 25.10 kg/m  Physical Exam Vitals reviewed.  Constitutional:      General: He is not in acute distress.    Appearance: Normal appearance.  HENT:     Head: Normocephalic and atraumatic.  Eyes:     General: No scleral icterus.    Extraocular Movements: Extraocular movements intact.   Cardiovascular:     Rate and Rhythm: Normal rate and regular rhythm.     Heart sounds: Normal heart sounds. No murmur heard. Pulmonary:     Effort: Pulmonary effort is normal. No respiratory distress.     Breath sounds: Rhonchi (Faint bilateral) present.  Abdominal:     General: There is no distension.     Palpations: Abdomen is soft.  Skin:    General: Skin is warm  and dry.     Comments: Clubbing  Neurological:     General: No focal deficit present.     Mental Status: He is alert and oriented to person, place, and time.     Cranial Nerves: No cranial nerve deficit.     Motor: No weakness.    Diagnostic Tests: CT CHEST WITHOUT CONTRAST LOW-DOSE FOR LUNG CANCER SCREENING   TECHNIQUE: Multidetector CT imaging of the chest was performed following the standard protocol without IV contrast.   RADIATION DOSE REDUCTION: This exam was performed according to the departmental dose-optimization program which includes automated exposure control, adjustment of the mA and/or kV according to patient size and/or use of iterative reconstruction technique.   COMPARISON:  10/10/2019 screening chest CT   FINDINGS: Cardiovascular: Normal heart size. No significant pericardial effusion/thickening. Vessel coronary atherosclerosis. Atherosclerotic nonaneurysmal thoracic aorta. Normal caliber pulmonary arteries.   Mediastinum/Nodes: No significant thyroid nodules. Unremarkable esophagus. No pathologically enlarged axillary, mediastinal or hilar lymph nodes, noting limited sensitivity for the detection of hilar adenopathy on this noncontrast study.   Lungs/Pleura: No pneumothorax. No pleural effusion. Moderate paraseptal and centrilobular emphysema with diffuse bronchial wall thickening. No acute consolidative airspace disease or lung masses. Tiny 2 mm anterior left upper lobe pulmonary nodule is stable. New irregular solid right middle lobe pulmonary nodule measuring 12.9 mm in volume  derived mean diameter (series 4/image 294). New irregular solid right upper lobe pulmonary nodule measuring 11.4 mm in volume derived mean diameter (series 4/image 153).   Upper abdomen: No acute abnormality.   Musculoskeletal: No aggressive appearing focal osseous lesions. Moderate thoracic spondylosis.   IMPRESSION: 1. Lung-RADS 4B, suspicious. Additional imaging evaluation or consultation with Pulmonology or Thoracic Surgery recommended. Two new irregular solid right pulmonary nodules measuring 12.9 mm in volume derived mean diameter in the right middle lobe and 11.4 mm in volume derived mean diameter in the right upper lobe, suspicious for synchronous primary bronchogenic malignancies. PET-CT recommended for further characterization at this time. 2. One vessel coronary atherosclerosis. 3. Aortic Atherosclerosis (ICD10-I70.0) and Emphysema (ICD10-J43.9).   These results will be called to the ordering clinician or representative by the Radiologist Assistant, and communication documented in the PACS or [employer].     Electronically Signed   By: Delbert Phenix M.D.   On: 12/27/2022 09:29 NUCLEAR MEDICINE PET SKULL BASE TO THIGH   TECHNIQUE: 8.9 mCi F-18 FDG was injected intravenously. Full-ring PET imaging was performed from the skull base to thigh after the radiotracer. CT data was obtained and used for attenuation correction and anatomic localization.   Fasting blood glucose: 94 mg/dl   COMPARISON:  None Available.   FINDINGS: NECK: No hypermetabolic lymph nodes in the neck.   Incidental CT findings: None.   CHEST: Within the RIGHT upper lobe solid nodule measuring 9 mm on image 71 has intense metabolic activity for size with SUV max equal 4.1.   Second nodule within the posterior aspect of the RIGHT middle lobe measures 11 mm (image 95) and also with intense metabolic activity SUV max equal 5.5   No hypermetabolic mediastinal lymph nodes.   There is mild  metabolic activity associated with RIGHT lobe atelectasis without focal lesion.   Incidental CT findings: None.   ABDOMEN/PELVIS: No abnormal hypermetabolic activity within the liver, pancreas, adrenal glands, or spleen. No hypermetabolic lymph nodes in the abdomen or pelvis.   Incidental CT findings: Atherosclerotic calcification of the aorta.   SKELETON: No focal hypermetabolic activity to suggest skeletal metastasis.  Incidental CT findings: None   IMPRESSION: 1. Two hypermetabolic pulmonary nodules in the RIGHT lung are concerning for primary synchronous bronchogenic carcinoma. 2. No evidence of metastatic adenopathy in the chest. 3. No evidence distant metastatic disease.     Electronically Signed   By: Genevive Bi M.D.   On: 02/02/2023 10:47 I personally reviewed the CT and PET images.  There is a 9 mm right upper lobe nodule and an 11 mm right middle lobe nodule.  Both are markedly hypermetabolic.  There is significant emphysema and probably some pulmonary fibrosis as well.  Aortic and coronary atherosclerosis.  Impression: Terrence Swanson is a 62 year old former smoker with a past medical history significant for COPD, asthma, reflux, peripheral vascular disease, iliac stents, anxiety, depression, herniated disc, and diverticulosis.  He smoked about 1.5 packs a day for 45 years prior to quitting about 5 years ago.  Found to have right upper and middle lobe nodules on a low-dose CT for lung cancer screening.  Both nodules are hypermetabolic on PET.  There is no evidence of regional or distant metastatic disease.  Right upper and middle lobe lung nodules-both are highly suspicious for new primary bronchogenic carcinomas.  Infectious and inflammatory nodules are in the differential diagnosis, but these have to be considered a lung cancer and less can be proven otherwise.  Clinical stage could be synchronous T1, N0, 1A versus T4, N0, IIIA.  We discussed potential diagnostic and  treatment options.  He has some social issues regarding family which would make surgery challenging but he feels he has enough support to do surgery if that is recommended.  The upper lobe nodule is central and not amenable to a wedge resection.  I would prefer to have a diagnosis prior to attempting to resect that.  Therefore I recommended we start with a navigational bronchoscopy to sample the right upper lobe nodule.  May or may not sample the middle lobe nodule as it is peripheral, in an area of bleb disease, and would be high risk for pneumothorax.  I described the proposed procedure to him.  He understands the need for general anesthesia.  He understands this is an endoscopic procedure that would be done as an outpatient.  I informed him of the indications, risk, benefits, and alternatives.  He understands the risks include, but not limited to pneumothorax, bleeding, failure to make a diagnosis, as well as general risk such as DVT, PE, bleeding, MI, or other unforeseeable complications  He agrees to proceed.  He had questions about potential surgical resection.  We would need pulmonary function testing to see if he is a candidate.  Would require at least an upper lobectomy and wedge resection of the middle lobe but more likely would be a upper and middle bilobectomy.  Will order PFTs.  I described to him the general approach to robotic lung resection.  I informed him of the need for general anesthesia, the incisions to be used, the use of the surgical robot, the use of a drainage tube postoperatively, the expected hospital stay, and the overall recovery.  I informed him of the indications, risks, benefits, and alternatives.  He understands the risks include, but not limited to death, MI, DVT, PE, bleeding, possible need for transfusion, infection, prolonged air leak, cardiac arrhythmias, as well as possibility of other unforeseeable complications.    Plan: Super D CT for planning for  navigational bronchoscopy Electromagnetic navigational bronchoscopy-will call to schedule Pulmonary function testing with and without bronchodilators See Dr.  Kandala on Monday as scheduled  [name], MD [facility] [phone]

## 2023-02-17 NOTE — H&P (View-Only) (Signed)
 PCP is Lianne Moris, PA-C Referring Provider is Sasser, Clarene Critchley, MD  Chief Complaint  Patient presents with   Lung Lesion    Surgical consult/ Chest CT 12/09/22/ PET Scan 02/02/23/MR Brain 06/10/22    HPI: [name] sent for consultation regarding right upper and middle lobe lung nodules.  Terrence Swanson is a 62 year old former smoker with a past medical history significant for COPD, asthma, reflux, peripheral vascular disease, iliac stents, anxiety, depression, herniated disc, and diverticulosis.  Terrence Swanson smoked about 1.5 packs a day for 45 years prior to quitting about 5 years ago.  Terrence Swanson had a low-dose CT for lung cancer screening in September which showed suspicious nodules in the right upper lobe and right middle lobe.  There also was emphysema and probably some pulmonary fibrosis as well.  PET/CT showed both nodules were hypermetabolic.  There was no evidence of mediastinal or hilar adenopathy.  Terrence Swanson is retired.  Terrence Swanson cares for his wife who has had multiple strokes and requires around-the-clock care.  Terrence Swanson has a son who also has some issues who Terrence Swanson cares for as well.  Terrence Swanson does have other family close by that can potentially help with his care.  Terrence Swanson has history of chest pain and dizzy spells.  Terrence Swanson said no weight loss.  Does have chronic headaches, unchanged recently.  Occasional cough.  Denies wheezing shortness of breath with routine activities.  Does not exercise.  Zubrod Score: At the time of surgery this patient's most appropriate activity status/level should be described as: []     0    Normal activity, no symptoms [x]     1    Restricted in physical strenuous activity but ambulatory, able to do out light work []     2    Ambulatory and capable of self care, unable to do work activities, up and about >50 % of waking hours                              []     3    Only limited self care, in bed greater than 50% of waking hours []     4    Completely disabled, no self care, confined to bed or chair []     5     Moribund  Past Medical History:  Diagnosis Date   Anxiety    Asthma    Complication of anesthesia    difficult to wake up after anesthesia   COPD (chronic obstructive pulmonary disease) (HCC)    Depression    Diverticulosis    GERD (gastroesophageal reflux disease)    Herniated lumbar intervertebral disc    Labral tear of shoulder    left shoulder   Peripheral vascular disease (HCC)     Past Surgical History:  Procedure Laterality Date   ABDOMINAL AORTAGRAM N/A 07/24/2014   Procedure: ABDOMINAL Ronny Flurry;  Surgeon: Iran Ouch, MD;  Location: [facility];  Service: Cardiovascular;  Laterality: N/A;   ABDOMINAL AORTOGRAM N/A 07/28/2016   Procedure: Abdominal Aortogram;  Surgeon: Iran Ouch, MD;  Location: [facility]AS[facility];  Service: Cardiovascular;  Laterality: N/A;   INGUINAL HERNIA REPAIR Left 03/18/2021   Procedure: LEFT INGUINAL HERNIA REAPAIR W/ MESH (ADULT);  Surgeon: Franky Macho, MD;  Location: [facility];  Service: General;  Laterality: Left;   LEFT HEART CATH AND CORONARY ANGIOGRAPHY N/A 07/27/2019   Procedure: LEFT HEART CATH AND CORONARY ANGIOGRAPHY;  Surgeon: Iran Ouch, MD;  Location: [facility];  Service: Cardiovascular;  Laterality: N/A;   PERCUTANEOUS STENT INTERVENTION N/A 07/24/2014   Procedure: PERCUTANEOUS STENT INTERVENTION;  Surgeon: Iran Ouch, MD;  Location: [facility];  Service: Cardiovascular;  Laterality: N/A;  rt and left common iliacs   PERIPHERAL VASCULAR INTERVENTION Bilateral 07/28/2016   Procedure: Peripheral Vascular Intervention;  Surgeon: Iran Ouch, MD;  Location: [facility];  Service: Cardiovascular;  Laterality: Bilateral;  COMMON ILIACS    Family History  Problem Relation Age of Onset   Diabetes Mother    Cancer Father        Throat    Social History Social History   Tobacco Use   Smoking status: Former    Current packs/day: 0.00    Average packs/day: 1.5 packs/day for 45.0 years (67.5 ttl  pk-yrs)    Types: Cigarettes    Start date: 08/27/1973    Quit date: 08/28/2018    Years since quitting: 4.4   Smokeless tobacco: Never  Vaping Use   Vaping status: Never Used  Substance Use Topics   Alcohol use: No   Drug use: No    Current Outpatient Medications  Medication Sig Dispense Refill   aspirin EC 81 MG tablet Take 1 tablet (81 mg total) by mouth daily. (Patient taking differently: Take 81 mg by mouth at bedtime.)     atorvastatin (LIPITOR) 40 MG tablet Take 1 tablet (40 mg total) by mouth daily. 30 tablet 10   clonazePAM (KLONOPIN) 1 MG tablet Take 1 mg by mouth at bedtime.     hydrocortisone cream 1 % Apply 1 application topically daily as needed for itching.     pantoprazole (PROTONIX) 40 MG tablet Take 40 mg by mouth daily.     No current facility-administered medications for this visit.    No Known Allergies  Review of Systems  Constitutional:  Negative for unexpected weight change.  HENT:  Negative for trouble swallowing and voice change.   Eyes:  Negative for visual disturbance.  Respiratory:  Positive for cough. Negative for shortness of breath and wheezing.   Cardiovascular:  Positive for chest pain.  Genitourinary:  Negative for difficulty urinating and dysuria.  Musculoskeletal:  Positive for arthralgias and joint swelling.  Skin:        Itching  Neurological:  Positive for dizziness and headaches.       Memory issues  Psychiatric/Behavioral:  Positive for dysphoric mood. The patient is nervous/anxious.   All other systems reviewed and are negative.   BP 134/78   Pulse 64   Resp 20   Ht 5\' 11"  (1.803 m)   Wt 180 lb (81.6 kg)   SpO2 96% Comment: RA  BMI 25.10 kg/m  Physical Exam Vitals reviewed.  Constitutional:      General: Terrence Swanson is not in acute distress.    Appearance: Normal appearance.  HENT:     Head: Normocephalic and atraumatic.  Eyes:     General: No scleral icterus.    Extraocular Movements: Extraocular movements intact.   Cardiovascular:     Rate and Rhythm: Normal rate and regular rhythm.     Heart sounds: Normal heart sounds. No murmur heard. Pulmonary:     Effort: Pulmonary effort is normal. No respiratory distress.     Breath sounds: Rhonchi (Faint bilateral) present.  Abdominal:     General: There is no distension.     Palpations: Abdomen is soft.  Skin:    General: Skin is warm  and dry.     Comments: Clubbing  Neurological:     General: No focal deficit present.     Mental Status: Terrence Swanson is alert and oriented to person, place, and time.     Cranial Nerves: No cranial nerve deficit.     Motor: No weakness.    Diagnostic Tests: CT CHEST WITHOUT CONTRAST LOW-DOSE FOR LUNG CANCER SCREENING   TECHNIQUE: Multidetector CT imaging of the chest was performed following the standard protocol without IV contrast.   RADIATION DOSE REDUCTION: This exam was performed according to the departmental dose-optimization program which includes automated exposure control, adjustment of the mA and/or kV according to patient size and/or use of iterative reconstruction technique.   COMPARISON:  10/10/2019 screening chest CT   FINDINGS: Cardiovascular: Normal heart size. No significant pericardial effusion/thickening. Vessel coronary atherosclerosis. Atherosclerotic nonaneurysmal thoracic aorta. Normal caliber pulmonary arteries.   Mediastinum/Nodes: No significant thyroid nodules. Unremarkable esophagus. No pathologically enlarged axillary, mediastinal or hilar lymph nodes, noting limited sensitivity for the detection of hilar adenopathy on this noncontrast study.   Lungs/Pleura: No pneumothorax. No pleural effusion. Moderate paraseptal and centrilobular emphysema with diffuse bronchial wall thickening. No acute consolidative airspace disease or lung masses. Tiny 2 mm anterior left upper lobe pulmonary nodule is stable. New irregular solid right middle lobe pulmonary nodule measuring 12.9 mm in volume  derived mean diameter (series 4/image 294). New irregular solid right upper lobe pulmonary nodule measuring 11.4 mm in volume derived mean diameter (series 4/image 153).   Upper abdomen: No acute abnormality.   Musculoskeletal: No aggressive appearing focal osseous lesions. Moderate thoracic spondylosis.   IMPRESSION: 1. Lung-RADS 4B, suspicious. Additional imaging evaluation or consultation with Pulmonology or Thoracic Surgery recommended. Two new irregular solid right pulmonary nodules measuring 12.9 mm in volume derived mean diameter in the right middle lobe and 11.4 mm in volume derived mean diameter in the right upper lobe, suspicious for synchronous primary bronchogenic malignancies. PET-CT recommended for further characterization at this time. 2. One vessel coronary atherosclerosis. 3. Aortic Atherosclerosis (ICD10-I70.0) and Emphysema (ICD10-J43.9).   These results will be called to the ordering clinician or representative by the Radiologist Assistant, and communication documented in the PACS or [employer].     Electronically Signed   By: Delbert Phenix M.D.   On: 12/27/2022 09:29 NUCLEAR MEDICINE PET SKULL BASE TO THIGH   TECHNIQUE: 8.9 mCi F-18 FDG was injected intravenously. Full-ring PET imaging was performed from the skull base to thigh after the radiotracer. CT data was obtained and used for attenuation correction and anatomic localization.   Fasting blood glucose: 94 mg/dl   COMPARISON:  None Available.   FINDINGS: NECK: No hypermetabolic lymph nodes in the neck.   Incidental CT findings: None.   CHEST: Within the RIGHT upper lobe solid nodule measuring 9 mm on image 71 has intense metabolic activity for size with SUV max equal 4.1.   Second nodule within the posterior aspect of the RIGHT middle lobe measures 11 mm (image 95) and also with intense metabolic activity SUV max equal 5.5   No hypermetabolic mediastinal lymph nodes.   There is mild  metabolic activity associated with RIGHT lobe atelectasis without focal lesion.   Incidental CT findings: None.   ABDOMEN/PELVIS: No abnormal hypermetabolic activity within the liver, pancreas, adrenal glands, or spleen. No hypermetabolic lymph nodes in the abdomen or pelvis.   Incidental CT findings: Atherosclerotic calcification of the aorta.   SKELETON: No focal hypermetabolic activity to suggest skeletal metastasis.  Incidental CT findings: None   IMPRESSION: 1. Two hypermetabolic pulmonary nodules in the RIGHT lung are concerning for primary synchronous bronchogenic carcinoma. 2. No evidence of metastatic adenopathy in the chest. 3. No evidence distant metastatic disease.     Electronically Signed   By: Genevive Bi M.D.   On: 02/02/2023 10:47 I personally reviewed the CT and PET images.  There is a 9 mm right upper lobe nodule and an 11 mm right middle lobe nodule.  Both are markedly hypermetabolic.  There is significant emphysema and probably some pulmonary fibrosis as well.  Aortic and coronary atherosclerosis.  Impression: Terrence Swanson is a 62 year old former smoker with a past medical history significant for COPD, asthma, reflux, peripheral vascular disease, iliac stents, anxiety, depression, herniated disc, and diverticulosis.  Terrence Swanson smoked about 1.5 packs a day for 45 years prior to quitting about 5 years ago.  Found to have right upper and middle lobe nodules on a low-dose CT for lung cancer screening.  Both nodules are hypermetabolic on PET.  There is no evidence of regional or distant metastatic disease.  Right upper and middle lobe lung nodules-both are highly suspicious for new primary bronchogenic carcinomas.  Infectious and inflammatory nodules are in the differential diagnosis, but these have to be considered a lung cancer and less can be proven otherwise.  Clinical stage could be synchronous T1, N0, 1A versus T4, N0, IIIA.  We discussed potential diagnostic and  treatment options.  Terrence Swanson has some social issues regarding family which would make surgery challenging but Terrence Swanson feels Terrence Swanson has enough support to do surgery if that is recommended.  The upper lobe nodule is central and not amenable to a wedge resection.  I would prefer to have a diagnosis prior to attempting to resect that.  Therefore I recommended we start with a navigational bronchoscopy to sample the right upper lobe nodule.  May or may not sample the middle lobe nodule as it is peripheral, in an area of bleb disease, and would be high risk for pneumothorax.  I described the proposed procedure to him.  Terrence Swanson understands the need for general anesthesia.  Terrence Swanson understands this is an endoscopic procedure that would be done as an outpatient.  I informed him of the indications, risk, benefits, and alternatives.  Terrence Swanson understands the risks include, but not limited to pneumothorax, bleeding, failure to make a diagnosis, as well as general risk such as DVT, PE, bleeding, MI, or other unforeseeable complications  Terrence Swanson agrees to proceed.  Terrence Swanson had questions about potential surgical resection.  We would need pulmonary function testing to see if Terrence Swanson is a candidate.  Would require at least an upper lobectomy and wedge resection of the middle lobe but more likely would be a upper and middle bilobectomy.  Will order PFTs.  I described to him the general approach to robotic lung resection.  I informed him of the need for general anesthesia, the incisions to be used, the use of the surgical robot, the use of a drainage tube postoperatively, the expected hospital stay, and the overall recovery.  I informed him of the indications, risks, benefits, and alternatives.  Terrence Swanson understands the risks include, but not limited to death, MI, DVT, PE, bleeding, possible need for transfusion, infection, prolonged air leak, cardiac arrhythmias, as well as possibility of other unforeseeable complications.    Plan: Super D CT for planning for  navigational bronchoscopy Electromagnetic navigational bronchoscopy-will call to schedule Pulmonary function testing with and without bronchodilators See Dr.  Kandala on Monday as scheduled  [name], MD [facility] [phone]

## 2023-02-18 ENCOUNTER — Other Ambulatory Visit: Payer: Self-pay | Admitting: *Deleted

## 2023-02-18 ENCOUNTER — Encounter: Payer: Self-pay | Admitting: *Deleted

## 2023-02-18 DIAGNOSIS — R911 Solitary pulmonary nodule: Secondary | ICD-10-CM

## 2023-02-18 DIAGNOSIS — Z5181 Encounter for therapeutic drug level monitoring: Secondary | ICD-10-CM

## 2023-02-21 ENCOUNTER — Inpatient Hospital Stay: Payer: Medicare Other | Attending: Oncology | Admitting: Oncology

## 2023-02-21 ENCOUNTER — Inpatient Hospital Stay: Payer: Medicare Other

## 2023-02-21 VITALS — BP 119/63 | HR 72 | Temp 98.6°F | Resp 18 | Ht 72.0 in | Wt 192.6 lb

## 2023-02-21 DIAGNOSIS — Z87891 Personal history of nicotine dependence: Secondary | ICD-10-CM | POA: Insufficient documentation

## 2023-02-21 DIAGNOSIS — C3490 Malignant neoplasm of unspecified part of unspecified bronchus or lung: Secondary | ICD-10-CM

## 2023-02-21 DIAGNOSIS — R918 Other nonspecific abnormal finding of lung field: Secondary | ICD-10-CM | POA: Insufficient documentation

## 2023-02-21 DIAGNOSIS — R911 Solitary pulmonary nodule: Secondary | ICD-10-CM

## 2023-02-21 NOTE — Patient Instructions (Addendum)
[facility]  Discharge Instructions  You were seen and examined today by Dr. Anders Simmonds. Dr. Anders Simmonds is a medical oncologist, meaning that he specializes in the treatment of cancer diagnoses. Dr. Anders Simmonds discussed your past medical history, family history of cancers, and the events that led to you being here today.  You were referred to Dr. Anders Simmonds due to an abnormal PET scan which revealed two nodules in your right lung highly concerning for cancer. There was no spread of the cancer beyond the lung, which is great news.  To complete the staging work-up, we will do a brain MRI.  Please proceed with your biopsy as scheduled.  Follow-up one week after biopsy to discuss next steps. The most likely options for treatment will be surgery or radiation. If surgery is not an option due to the fact that there are two nodules in different lobes, the best course of action would be radiation therapy. Radiation therapy can be done at [facility] in [place] or at [facility] in [place].  Thank you for choosing [facility] to provide your oncology and hematology care.   To afford each patient quality time with our provider, please arrive at least 15 minutes before your scheduled appointment time. You may need to reschedule your appointment if you arrive late (10 or more minutes). Arriving late affects you and other patients whose appointments are after yours.  Also, if you miss three or more appointments without notifying the office, you may be dismissed from the clinic at the provider's discretion.    Again, thank you for choosing [facility].  Our hope is that these requests will decrease the amount of time that you wait before being seen by our physicians.   If you have a lab appointment with the Cancer Center - please note that after April 8th, all labs will be drawn in the cancer center.  You do not have to check in or register with the main  entrance as you have in the past but will complete your check-in at the cancer center.            _____________________________________________________________  Should you have questions after your visit to [facility], please contact our office at [phone] and follow the prompts.  Our office hours are 8:00 a.m. to 4:30 p.m. Monday - Thursday and 8:00 a.m. to 2:30 p.m. Friday.  Please note that voicemails left after 4:00 p.m. may not be returned until the following business day.  We are closed weekends and all major holidays.  You do have access to a nurse 24-7, just call the main number to the clinic [phone] and do not press any options, hold on the line and a nurse will answer the phone.    For prescription refill requests, have your pharmacy contact our office and allow 72 hours.    Masks are no longer required in the cancer centers. If you would like for your care team to wear a mask while they are taking care of you, please let them know. You may have one support person who is at least 64 years old accompany you for your appointments.

## 2023-02-21 NOTE — Assessment & Plan Note (Signed)
Two nodules identified on right lung, one peripherally located and one centrally located.  No distal metastasis at this time.  Awaiting biopsy to determine the diagnosis. Discussed potential treatment options including surgery and radiation and if needed chemotherapy and immunotherapy. -Obtain a brain MRI to rule out brain metastasis -Undergo bronchoscopy with Dr. Dorris Fetch -Radiation oncology referral to see Dr. Langston Masker in [place] -Follow-up after the results of path to discuss further treatment plan

## 2023-02-21 NOTE — Progress Notes (Signed)
Hematology-Oncology Clinic Note  Terrence Swanson, [place]   Reason for Referral: Right lung nodules  Oncology History  Lung nodule  12/09/2022 Imaging   Lung-RADS 4B, suspicious. Two new irregular solid right pulmonary nodules measuring 12.9 mm in volume derived mean diameter in the right middle lobe and 11.4 mm in volume derived mean diameter in the right upper lobe, suspicious for synchronous primary bronchogenic malignancies.    01/20/2023 PET scan   Within the RIGHT upper lobe solid nodule measuring 9 mm on image 71 has intense metabolic activity for size with SUV max equal 4.1.   Second nodule within the posterior aspect of the RIGHT middle lobe measures 11 mm (image 95) and also with intense metabolic activity SUV max equal 5.5   No hypermetabolic mediastinal lymph nodes.  IMPRESSION: 1. Two hypermetabolic pulmonary nodules in the RIGHT lung are concerning for primary synchronous bronchogenic carcinoma. 2. No evidence of metastatic adenopathy in the chest. 3. No evidence distant metastatic disease.   02/17/2023 Initial Diagnosis   Lung nodule     History of Presenting Illness: Terrence Swanson 62 y.o. male is referred to oncology for newly found right lung nodules on routine low-dose CT scan for lung cancer screening.  Patient was seen by Dr. Orson Aloe with cardio thoracic surgery and is scheduled to have a bronchoscopy done after his pulmonary function tests.  Patient reports no complaints today.  He denies chest pain, shortness of breath, cough, weight loss, loss of appetite.  He takes care of his wife primarily who is mostly restricted because of previous strokes.  Patient is a former smoker and quit a couple of years ago.  Patient does not drink alcohol and has no family history of lung cancer.  He lives in [place] with his wife and is currently retired.  We discussed the CT scan results in detail and also the PET scan results.  Patient has some local disease but 2 nodules in the  same lung.  We also discussed referral to radiation oncology in case the nodules are difficult for surgical resection.  Medical History: Past Medical History:  Diagnosis Date   Anxiety    Asthma    Complication of anesthesia    difficult to wake up after anesthesia   COPD (chronic obstructive pulmonary disease) (HCC)    Depression    Diverticulosis    GERD (gastroesophageal reflux disease)    Herniated lumbar intervertebral disc    Labral tear of shoulder    left shoulder   Peripheral vascular disease (HCC)     Surgical history: Past Surgical History:  Procedure Laterality Date   ABDOMINAL AORTAGRAM N/A 07/24/2014   Procedure: ABDOMINAL Ronny Flurry;  Surgeon: Iran Ouch, MD;  Location: [facility] CATH [facility];  Service: Cardiovascular;  Laterality: N/A;   ABDOMINAL AORTOGRAM N/A 07/28/2016   Procedure: Abdominal Aortogram;  Surgeon: Iran Ouch, MD;  Location: [facility] INVASIVE CV LAB;  Service: Cardiovascular;  Laterality: N/A;   INGUINAL HERNIA REPAIR Left 03/18/2021   Procedure: LEFT INGUINAL HERNIA REAPAIR W/ MESH (ADULT);  Surgeon: Franky Macho, MD;  Location: [facility];  Service: General;  Laterality: Left;   LEFT HEART CATH AND CORONARY ANGIOGRAPHY N/A 07/27/2019   Procedure: LEFT HEART CATH AND CORONARY ANGIOGRAPHY;  Surgeon: Iran Ouch, MD;  Location: [facility]VAS[facility] CV [facility];  Service: Cardiovascular;  Laterality: N/A;   PERCUTANEOUS STENT INTERVENTION N/A 07/24/2014   Procedure: PERCUTANEOUS STENT INTERVENTION;  Surgeon: Iran Ouch, MD;  Location: [facility]ATH [facility];  Service:  Cardiovascular;  Laterality: N/A;  rt and left common iliacs   PERIPHERAL VASCULAR INTERVENTION Bilateral 07/28/2016   Procedure: Peripheral Vascular Intervention;  Surgeon: Iran Ouch, MD;  Location: [facility];  Service: Cardiovascular;  Laterality: Bilateral;  COMMON ILIACS    Social History: Social History   Socioeconomic History   Marital status: Legally Separated    Spouse name: Not on file    Number of children: Not on file   Years of education: Not on file   Highest education level: Not on file  Occupational History   Not on file  Tobacco Use   Smoking status: Former    Current packs/day: 0.00    Average packs/day: 1.5 packs/day for 45.0 years (67.5 ttl pk-yrs)    Types: Cigarettes    Start date: 08/27/1973    Quit date: 08/28/2018    Years since quitting: 4.4   Smokeless tobacco: Never  Vaping Use   Vaping status: Never Used  Substance and Sexual Activity   Alcohol use: No   Drug use: No   Sexual activity: Not on file  Other Topics Concern   Not on file  Social History Narrative   Not on file   Social Determinants of Health   Financial Resource Strain: Not on file  Food Insecurity: Not on file  Transportation Needs: Not on file  Physical Activity: Not on file  Stress: Not on file  Social Connections: Not on file  Intimate Partner Violence: Not on file    Family History: Family History  Problem Relation Age of Onset   Diabetes Mother    Cancer Father        Throat    Allergies:  has No Known Allergies.  Medications:  Current Outpatient Medications  Medication Sig Dispense Refill   aspirin EC 81 MG tablet Take 1 tablet (81 mg total) by mouth daily. (Patient taking differently: Take 81 mg by mouth at bedtime.)     atorvastatin (LIPITOR) 40 MG tablet Take 1 tablet (40 mg total) by mouth daily. 30 tablet 10   clonazePAM (KLONOPIN) 1 MG tablet Take 1 mg by mouth at bedtime.     hydrocortisone cream 1 % Apply 1 application topically daily as needed for itching.     pantoprazole (PROTONIX) 40 MG tablet Take 40 mg by mouth daily.     No current facility-administered medications for this visit.    Review of Systems: Constitutional: Denies fevers, chills or abnormal night sweats Eyes: Denies blurriness of vision, double vision or watery eyes Ears, nose, mouth, throat, and face: Denies mucositis or sore throat Respiratory: Denies cough, dyspnea or  wheezes Cardiovascular: Denies palpitation, chest discomfort or lower extremity swelling Gastrointestinal:  Denies nausea, heartburn or change in bowel habits Skin: Denies abnormal skin rashes Lymphatics: Denies new lymphadenopathy or easy bruising Neurological:Denies numbness, tingling or new weaknesses Behavioral/Psych: Mood is stable, no new changes  All other systems were reviewed with the patient and are negative.  Physical Examination: ECOG PERFORMANCE STATUS: 0 - Asymptomatic  Vitals:   02/21/23 1113  BP: 119/63  Pulse: 72  Resp: 18  Temp: 98.6 F (37 C)  SpO2: 99%   Filed Weights   02/21/23 1113  Weight: 192 lb 9.6 oz (87.4 kg)    GENERAL:alert, no distress and comfortable LUNGS: clear to auscultation and percussion with normal breathing effort HEART: regular rate & rhythm and no murmurs and no lower extremity edema ABDOMEN:abdomen soft, non-tender and normal bowel sounds Musculoskeletal:no cyanosis of digits  and no clubbing  PSYCH: alert & oriented x 3 with fluent speech   Laboratory Data: I have reviewed the data as listed Lab Results  Component Value Date   WBC 10.6 07/24/2019   HGB 17.3 07/24/2019   HCT 51.2 (H) 07/24/2019   MCV 89 07/24/2019   PLT 273 07/24/2019    Radiographic Studies: I reviewed the following images and agree with the findings. EXAM: NUCLEAR MEDICINE PET SKULL BASE TO THIGH   TECHNIQUE: 8.9 mCi F-18 FDG was injected intravenously. Full-ring PET imaging was performed from the skull base to thigh after the radiotracer. CT data was obtained and used for attenuation correction and anatomic localization.   Fasting blood glucose: 94 mg/dl   COMPARISON:  None Available.   FINDINGS: NECK: No hypermetabolic lymph nodes in the neck.   Incidental CT findings: None.   CHEST: Within the RIGHT upper lobe solid nodule measuring 9 mm on image 71 has intense metabolic activity for size with SUV max equal 4.1.   Second nodule within  the posterior aspect of the RIGHT middle lobe measures 11 mm (image 95) and also with intense metabolic activity SUV max equal 5.5   No hypermetabolic mediastinal lymph nodes.   There is mild metabolic activity associated with RIGHT lobe atelectasis without focal lesion.   Incidental CT findings: None.   ABDOMEN/PELVIS: No abnormal hypermetabolic activity within the liver, pancreas, adrenal glands, or spleen. No hypermetabolic lymph nodes in the abdomen or pelvis.   Incidental CT findings: Atherosclerotic calcification of the aorta.   SKELETON: No focal hypermetabolic activity to suggest skeletal metastasis.   Incidental CT findings: None   IMPRESSION: 1. Two hypermetabolic pulmonary nodules in the RIGHT lung are concerning for primary synchronous bronchogenic carcinoma. 2. No evidence of metastatic adenopathy in the chest. 3. No evidence distant metastatic disease.  ASSESSMENT & PLAN:  Patient is a 62 year old male referred for recent diagnosis of lung nodules   Lung nodule Two nodules identified on right lung, one peripherally located and one centrally located.  No distal metastasis at this time.  Awaiting biopsy to determine the diagnosis. Discussed potential treatment options including surgery and radiation and if needed chemotherapy and immunotherapy. -Obtain a brain MRI to rule out brain metastasis -Undergo bronchoscopy with Dr. Dorris Fetch -Radiation oncology referral to see Dr. Langston Masker in [place] -Follow-up after the results of path to discuss further treatment plan   Orders Placed This Encounter  Procedures   MR BRAIN W WO CONTRAST    Standing Status:   Future    Standing Expiration Date:   02/21/2024    Order Specific Question:   If indicated for the ordered procedure, I authorize the administration of contrast media per Radiology protocol    Answer:   Yes    Order Specific Question:   What is the patient's sedation requirement?    Answer:   No Sedation    Order  Specific Question:   Does the patient have a pacemaker or implanted devices?    Answer:   No    Order Specific Question:   Use SRS Protocol?    Answer:   No    Order Specific Question:   Preferred imaging location?    Answer:   [facility] (table limit [phone])    Order Specific Question:   Release to patient    Answer:   Immediate    The total time spent in the appointment was 40 minutes encounter with patients including review of chart  and various tests results, discussions about plan of care and coordination of care plan   All questions were answered. The patient knows to call the clinic with any problems, questions or concerns. No barriers to learning was detected.  Cindie Crumbly, MD 11/25/20245:52 PM

## 2023-02-22 ENCOUNTER — Ambulatory Visit (HOSPITAL_COMMUNITY)
Admission: RE | Admit: 2023-02-22 | Discharge: 2023-02-22 | Disposition: A | Discharge status: Home / Self Care | Payer: Medicare Other | Source: Ambulatory Visit | Attending: Thoracic Surgery (Cardiothoracic Vascular Surgery) | Admitting: Thoracic Surgery (Cardiothoracic Vascular Surgery)

## 2023-02-22 DIAGNOSIS — R911 Solitary pulmonary nodule: Secondary | ICD-10-CM | POA: Insufficient documentation

## 2023-02-22 DIAGNOSIS — J439 Emphysema, unspecified: Secondary | ICD-10-CM | POA: Insufficient documentation

## 2023-02-22 LAB — PULMONARY FUNCTION TEST
DL/VA % pred: 49 %
DL/VA: 2.02 ml/min/mmHg/L
DLCO unc % pred: 57 %
DLCO unc: 15.97 ml/min/mmHg
FEF 25-75 Post: 3.14 L/s
FEF 25-75 Pre: 1.85 L/s
FEF2575-%Change-Post: 69 %
FEF2575-%Pred-Post: 107 %
FEF2575-%Pred-Pre: 63 %
FEV1-%Change-Post: 12 %
FEV1-%Pred-Post: 100 %
FEV1-%Pred-Pre: 89 %
FEV1-Post: 3.68 L
FEV1-Pre: 3.27 L
FEV1FVC-%Change-Post: -8 %
FEV1FVC-%Pred-Pre: 90 %
FEV6-%Change-Post: 12 %
FEV6-%Pred-Post: 116 %
FEV6-%Pred-Pre: 102 %
FEV6-Post: 5.41 L
FEV6-Pre: 4.78 L
FEV6FVC-%Change-Post: -7 %
FEV6FVC-%Pred-Post: 96 %
FEV6FVC-%Pred-Pre: 104 %
FVC-%Change-Post: 22 %
FVC-%Pred-Post: 120 %
FVC-%Pred-Pre: 98 %
FVC-Post: 5.89 L
FVC-Pre: 4.82 L
Post FEV1/FVC ratio: 62 %
Post FEV6/FVC ratio: 92 %
Pre FEV1/FVC ratio: 68 %
Pre FEV6/FVC Ratio: 99 %
RV % pred: 154 %
RV: 3.73 L
TLC % pred: 124 %
TLC: 9.08 L

## 2023-02-22 MED ORDER — ALBUTEROL SULFATE (2.5 MG/3ML) 0.083% IN NEBU
2.5000 mg | INHALATION_SOLUTION | Freq: Once | RESPIRATORY_TRACT | Status: AC
  Administered 2023-02-22: 2.5 mg via RESPIRATORY_TRACT

## 2023-02-28 ENCOUNTER — Ambulatory Visit (HOSPITAL_COMMUNITY)
Admission: RE | Admit: 2023-02-28 | Discharge: 2023-02-28 | Disposition: A | Discharge status: Home / Self Care | Payer: Medicare Other | Source: Ambulatory Visit | Attending: Oncology | Admitting: Oncology

## 2023-02-28 DIAGNOSIS — C3490 Malignant neoplasm of unspecified part of unspecified bronchus or lung: Secondary | ICD-10-CM | POA: Diagnosis present

## 2023-02-28 MED ORDER — GADOBUTROL 1 MMOL/ML IV SOLN
9.0000 mL | Freq: Once | INTRAVENOUS | Status: AC | PRN
  Administered 2023-02-28: 9 mL via INTRAVENOUS

## 2023-03-01 ENCOUNTER — Encounter: Payer: Self-pay | Admitting: *Deleted

## 2023-03-01 NOTE — Progress Notes (Signed)
Received call report from [facility] Radiology on abnormal MRI brain.  Dr. Anders Simmonds made aware.

## 2023-03-01 NOTE — Pre-Procedure Instructions (Signed)
Surgical Instructions   Your procedure is scheduled on Friday, December 6th. Report to [facility] Main Entrance "A" at 09:45 A.M., then check in with the Admitting office. Any questions or running late day of surgery: call [phone]  Questions prior to your surgery date: call [phone], Monday-Friday, 8am-4pm. If you experience any cold or flu symptoms such as cough, fever, chills, shortness of breath, etc. between now and your scheduled surgery, please notify us at the above number.     Remember:  Do not eat or drink after midnight the night before your surgery    Take these medicines the morning of surgery with A SIP OF WATER  atorvastatin (LIPITOR)  pantoprazole (PROTONIX)   May take these medicines IF NEEDED: acetaminophen (TYLENOL)  clonazePAM (KLONOPIN)    One week prior to surgery, STOP taking any Aleve, Naproxen, Ibuprofen, Motrin, Advil, Goody's, BC's, all herbal medications, fish oil, and non-prescription vitamins.                     Do NOT Smoke (Tobacco/Vaping) for 24 hours prior to your procedure.  If you use a CPAP at night, you may bring your mask/headgear for your overnight stay.   You will be asked to remove any contacts, glasses, piercing's, hearing aid's, dentures/partials prior to surgery. Please bring cases for these items if needed.    Patients discharged the day of surgery will not be allowed to drive home, and someone needs to stay with them for 24 hours.  SURGICAL WAITING ROOM VISITATION Patients may have no more than 2 support people in the waiting area - these visitors may rotate.   Pre-op nurse will coordinate an appropriate time for 1 ADULT support person, who may not rotate, to accompany patient in pre-op.  Children under the age of 64 must have an adult with them who is not the patient and must remain in the main waiting area with an adult.  If the patient needs to stay at the hospital during part of their recovery, the visitor guidelines  for inpatient rooms apply.  Please refer to the [facility] website for the visitor guidelines for any additional information.   If you received a COVID test during your pre-op visit  it is requested that you wear a mask when out in public, stay away from anyone that may not be feeling well and notify your surgeon if you develop symptoms. If you have been in contact with anyone that has tested positive in the last 10 days please notify you surgeon.      Pre-operative CHG Bathing Instructions   You can play a key role in reducing the risk of infection after surgery. Your skin needs to be as free of germs as possible. You can reduce the number of germs on your skin by washing with CHG (chlorhexidine gluconate) soap before surgery. CHG is an antiseptic soap that kills germs and continues to kill germs even after washing.   DO NOT use if you have an allergy to chlorhexidine/CHG or antibacterial soaps. If your skin becomes reddened or irritated, stop using the CHG and notify one of our RNs at [phone].              TAKE A SHOWER THE NIGHT BEFORE SURGERY AND THE DAY OF SURGERY    Please keep in mind the following:  DO NOT shave, including legs and underarms, 48 hours prior to surgery.   You may shave your face before/day of surgery.  Place  clean sheets on your bed the night before surgery Use a clean washcloth (not used since being washed) for each shower. DO NOT sleep with pet's night before surgery.  CHG Shower Instructions:  Wash your face and private area with normal soap. If you choose to wash your hair, wash first with your normal shampoo.  After you use shampoo/soap, rinse your hair and body thoroughly to remove shampoo/soap residue.  Turn the water OFF and apply half the bottle of CHG soap to a CLEAN washcloth.  Apply CHG soap ONLY FROM YOUR NECK DOWN TO YOUR TOES (washing for 3-5 minutes)  DO NOT use CHG soap on face, private areas, open wounds, or sores.  Pay special attention  to the area where your surgery is being performed.  If you are having back surgery, having someone wash your back for you may be helpful. Wait 2 minutes after CHG soap is applied, then you may rinse off the CHG soap.  Pat dry with a clean towel  Put on clean pajamas    Additional instructions for the day of surgery: DO NOT APPLY any lotions, deodorants, cologne, or perfumes.   Do not wear jewelry or makeup Do not wear nail polish, gel polish, artificial nails, or any other type of covering on natural nails (fingers and toes) Do not bring valuables to the hospital. [facility] is not responsible for valuables/personal belongings. Put on clean/comfortable clothes.  Please brush your teeth.  Ask your nurse before applying any prescription medications to the skin.

## 2023-03-02 ENCOUNTER — Ambulatory Visit (HOSPITAL_COMMUNITY)
Admission: RE | Admit: 2023-03-02 | Discharge: 2023-03-02 | Disposition: A | Discharge status: Home / Self Care | Payer: Medicare Other | Source: Ambulatory Visit | Attending: Thoracic Surgery (Cardiothoracic Vascular Surgery) | Admitting: Thoracic Surgery (Cardiothoracic Vascular Surgery)

## 2023-03-02 ENCOUNTER — Encounter (HOSPITAL_COMMUNITY): Payer: Self-pay

## 2023-03-02 ENCOUNTER — Other Ambulatory Visit: Payer: Self-pay

## 2023-03-02 ENCOUNTER — Encounter (HOSPITAL_COMMUNITY)
Admission: RE | Admit: 2023-03-02 | Discharge: 2023-03-02 | Disposition: A | Discharge status: Home / Self Care | Payer: Medicare Other | Source: Ambulatory Visit | Attending: Thoracic Surgery (Cardiothoracic Vascular Surgery) | Admitting: Thoracic Surgery (Cardiothoracic Vascular Surgery)

## 2023-03-02 VITALS — BP 129/69 | HR 60 | Temp 98.1°F | Resp 18 | Ht 72.0 in | Wt 188.8 lb

## 2023-03-02 DIAGNOSIS — R911 Solitary pulmonary nodule: Secondary | ICD-10-CM | POA: Insufficient documentation

## 2023-03-02 DIAGNOSIS — Z5181 Encounter for therapeutic drug level monitoring: Secondary | ICD-10-CM | POA: Diagnosis present

## 2023-03-02 DIAGNOSIS — I739 Peripheral vascular disease, unspecified: Secondary | ICD-10-CM

## 2023-03-02 HISTORY — DX: Unspecified osteoarthritis, unspecified site: M19.90

## 2023-03-02 LAB — CBC
HCT: 45 % (ref 39.0–52.0)
Hemoglobin: 14.9 g/dL (ref 13.0–17.0)
MCH: 29.3 pg (ref 26.0–34.0)
MCHC: 33.1 g/dL (ref 30.0–36.0)
MCV: 88.4 fL (ref 80.0–100.0)
Platelets: 224 10*3/uL (ref 150–400)
RBC: 5.09 MIL/uL (ref 4.22–5.81)
RDW: 12.6 % (ref 11.5–15.5)
WBC: 10.2 10*3/uL (ref 4.0–10.5)
nRBC: 0 % (ref 0.0–0.2)

## 2023-03-02 LAB — COMPREHENSIVE METABOLIC PANEL
ALT: 19 U/L (ref 0–44)
AST: 23 U/L (ref 15–41)
Albumin: 3.5 g/dL (ref 3.5–5.0)
Alkaline Phosphatase: 92 U/L (ref 38–126)
Anion gap: 8 (ref 5–15)
BUN: 8 mg/dL (ref 8–23)
CO2: 26 mmol/L (ref 22–32)
Calcium: 9.1 mg/dL (ref 8.9–10.3)
Chloride: 106 mmol/L (ref 98–111)
Creatinine, Ser: 0.95 mg/dL (ref 0.61–1.24)
GFR, Estimated: >60 mL/min (ref >60)
Glucose, Bld: 88 mg/dL (ref 70–99)
Potassium: 3.6 mmol/L (ref 3.5–5.1)
Sodium: 140 mmol/L (ref 135–145)
Total Bilirubin: 0.6 mg/dL (ref <1.2)
Total Protein: 6.3 g/dL — ABNORMAL LOW (ref 6.5–8.1)

## 2023-03-02 LAB — APTT: aPTT: 30 s (ref 24–36)

## 2023-03-02 LAB — PROTIME-INR
INR: 1.1 (ref 0.8–1.2)
Prothrombin Time: 13.9 s (ref 11.4–15.2)

## 2023-03-02 NOTE — Progress Notes (Signed)
PCP - Lianne Moris, PA-C Cardiologist - Dr. [name]- pt last saw him 07/27/19 Pulmonologist- Dr. Cyril Mourning- pt last saw 09/10/19  PPM/ICD - denies   Chest x-ray - 03/02/23 EKG - 03/02/23 Stress Test - 07/19/19 ECHO - denies Cardiac Cath - 07/27/19  Sleep Study - denies   DM- denies  Last dose of GLP1 agonist-  n/a   Blood Thinner Instructions: n/a Aspirin Instructions: Hold on DOS  ERAS Protcol - no, NPO   COVID TEST- n/a   Anesthesia review: yes, pt has cardiac hx. He says that he was not happy with Dr. Kirke Corin. He said Dr. Kirke Corin is a "criminal" because he told him his stents were blocked and he "learned from someone else that they were not blocked." He says he does not want to see him anymore.  Patient denies shortness of breath, fever, cough and chest pain at PAT appointment   All instructions explained to the patient, with a verbal understanding of the material. Patient agrees to go over the instructions while at home for a better understanding.  The opportunity to ask questions was provided.

## 2023-03-04 ENCOUNTER — Encounter (HOSPITAL_COMMUNITY)
Admission: RE | Disposition: A | Discharge status: Home / Self Care | Payer: Self-pay | Source: Home / Self Care | Attending: Thoracic Surgery (Cardiothoracic Vascular Surgery)

## 2023-03-04 ENCOUNTER — Encounter (HOSPITAL_COMMUNITY): Payer: Self-pay | Admitting: Thoracic Surgery (Cardiothoracic Vascular Surgery)

## 2023-03-04 ENCOUNTER — Ambulatory Visit (HOSPITAL_COMMUNITY)
Admission: RE | Admit: 2023-03-04 | Discharge: 2023-03-04 | Disposition: A | Discharge status: Home / Self Care | Payer: Medicare Other | Source: Ambulatory Visit | Attending: Thoracic Surgery (Cardiothoracic Vascular Surgery) | Admitting: Thoracic Surgery (Cardiothoracic Vascular Surgery)

## 2023-03-04 ENCOUNTER — Other Ambulatory Visit: Payer: Self-pay

## 2023-03-04 ENCOUNTER — Ambulatory Visit (HOSPITAL_COMMUNITY): Payer: Medicare Other | Admitting: Physician Assistant

## 2023-03-04 ENCOUNTER — Ambulatory Visit (HOSPITAL_COMMUNITY)
Admission: RE | Admit: 2023-03-04 | Discharge: 2023-03-04 | Disposition: A | Discharge status: Home / Self Care | Payer: Medicare Other | Attending: Thoracic Surgery (Cardiothoracic Vascular Surgery) | Admitting: Thoracic Surgery (Cardiothoracic Vascular Surgery)

## 2023-03-04 ENCOUNTER — Ambulatory Visit (HOSPITAL_COMMUNITY): Payer: Medicare Other | Admitting: Certified Registered Nurse Anesthetist

## 2023-03-04 ENCOUNTER — Other Ambulatory Visit (HOSPITAL_COMMUNITY): Payer: Self-pay | Admitting: Thoracic Surgery (Cardiothoracic Vascular Surgery)

## 2023-03-04 DIAGNOSIS — Z419 Encounter for procedure for purposes other than remedying health state, unspecified: Secondary | ICD-10-CM

## 2023-03-04 DIAGNOSIS — K219 Gastro-esophageal reflux disease without esophagitis: Secondary | ICD-10-CM | POA: Diagnosis not present

## 2023-03-04 DIAGNOSIS — R911 Solitary pulmonary nodule: Secondary | ICD-10-CM | POA: Diagnosis not present

## 2023-03-04 DIAGNOSIS — R079 Chest pain, unspecified: Secondary | ICD-10-CM | POA: Insufficient documentation

## 2023-03-04 DIAGNOSIS — I700 Atherosclerosis of aorta: Secondary | ICD-10-CM | POA: Diagnosis not present

## 2023-03-04 DIAGNOSIS — Z7982 Long term (current) use of aspirin: Secondary | ICD-10-CM | POA: Diagnosis not present

## 2023-03-04 DIAGNOSIS — Z833 Family history of diabetes mellitus: Secondary | ICD-10-CM | POA: Diagnosis not present

## 2023-03-04 DIAGNOSIS — F419 Anxiety disorder, unspecified: Secondary | ICD-10-CM | POA: Insufficient documentation

## 2023-03-04 DIAGNOSIS — Z79899 Other long term (current) drug therapy: Secondary | ICD-10-CM | POA: Diagnosis not present

## 2023-03-04 DIAGNOSIS — F32A Depression, unspecified: Secondary | ICD-10-CM | POA: Diagnosis not present

## 2023-03-04 DIAGNOSIS — M1990 Unspecified osteoarthritis, unspecified site: Secondary | ICD-10-CM | POA: Insufficient documentation

## 2023-03-04 DIAGNOSIS — I2510 Atherosclerotic heart disease of native coronary artery without angina pectoris: Secondary | ICD-10-CM | POA: Diagnosis not present

## 2023-03-04 DIAGNOSIS — J439 Emphysema, unspecified: Secondary | ICD-10-CM | POA: Insufficient documentation

## 2023-03-04 DIAGNOSIS — C3412 Malignant neoplasm of upper lobe, left bronchus or lung: Secondary | ICD-10-CM | POA: Diagnosis not present

## 2023-03-04 DIAGNOSIS — C3411 Malignant neoplasm of upper lobe, right bronchus or lung: Secondary | ICD-10-CM | POA: Diagnosis not present

## 2023-03-04 DIAGNOSIS — Z87891 Personal history of nicotine dependence: Secondary | ICD-10-CM | POA: Diagnosis not present

## 2023-03-04 DIAGNOSIS — I739 Peripheral vascular disease, unspecified: Secondary | ICD-10-CM | POA: Insufficient documentation

## 2023-03-04 HISTORY — PX: VIDEO BRONCHOSCOPY WITH ENDOBRONCHIAL NAVIGATION: SHX6175

## 2023-03-04 SURGERY — VIDEO BRONCHOSCOPY WITH ENDOBRONCHIAL NAVIGATION
Anesthesia: General | Site: Chest

## 2023-03-04 MED ORDER — FENTANYL CITRATE (PF) 250 MCG/5ML IJ SOLN
INTRAMUSCULAR | Status: DC | PRN
  Administered 2023-03-04: 100 ug via INTRAVENOUS
  Administered 2023-03-04: 50 ug via INTRAVENOUS

## 2023-03-04 MED ORDER — FENTANYL CITRATE (PF) 100 MCG/2ML IJ SOLN: 25.0000 ug | INTRAMUSCULAR | Status: DC | PRN

## 2023-03-04 MED ORDER — PROPOFOL 10 MG/ML IV BOLUS
INTRAVENOUS | Status: DC | PRN
  Administered 2023-03-04 (×2): 100 mg via INTRAVENOUS

## 2023-03-04 MED ORDER — SUGAMMADEX SODIUM 200 MG/2ML IV SOLN
INTRAVENOUS | Status: DC | PRN
  Administered 2023-03-04: 200 mg via INTRAVENOUS

## 2023-03-04 MED ORDER — LACTATED RINGERS IV SOLN: INTRAVENOUS | Status: DC

## 2023-03-04 MED ORDER — DEXAMETHASONE SODIUM PHOSPHATE 10 MG/ML IJ SOLN
INTRAMUSCULAR | Status: DC | PRN
  Administered 2023-03-04: 10 mg via INTRAVENOUS

## 2023-03-04 MED ORDER — DEXAMETHASONE SODIUM PHOSPHATE 10 MG/ML IJ SOLN
INTRAMUSCULAR | Status: AC
Start: 2023-03-04 — End: ?
  Filled 2023-03-04: qty 1

## 2023-03-04 MED ORDER — OXYCODONE HCL 5 MG PO TABS: 5.0000 mg | ORAL_TABLET | Freq: Once | ORAL | Status: DC | PRN

## 2023-03-04 MED ORDER — MIDAZOLAM HCL 2 MG/2ML IJ SOLN
INTRAMUSCULAR | Status: DC | PRN
  Administered 2023-03-04: 1 mg via INTRAVENOUS

## 2023-03-04 MED ORDER — ACETAMINOPHEN 500 MG PO TABS
1000.0000 mg | ORAL_TABLET | Freq: Once | ORAL | Status: AC
  Administered 2023-03-04: 1000 mg via ORAL
  Filled 2023-03-04: qty 2

## 2023-03-04 MED ORDER — CHLORHEXIDINE GLUCONATE 0.12 % MT SOLN
15.0000 mL | Freq: Once | OROMUCOSAL | Status: AC
  Administered 2023-03-04: 15 mL via OROMUCOSAL
  Filled 2023-03-04: qty 15

## 2023-03-04 MED ORDER — 0.9 % SODIUM CHLORIDE (POUR BTL) OPTIME
TOPICAL | Status: DC | PRN
  Administered 2023-03-04: 1000 mL

## 2023-03-04 MED ORDER — EPHEDRINE SULFATE-NACL 50-0.9 MG/10ML-% IV SOSY
PREFILLED_SYRINGE | INTRAVENOUS | Status: DC | PRN
  Administered 2023-03-04: 5 mg via INTRAVENOUS

## 2023-03-04 MED ORDER — ROCURONIUM BROMIDE 10 MG/ML (PF) SYRINGE
PREFILLED_SYRINGE | INTRAVENOUS | Status: DC | PRN
  Administered 2023-03-04: 20 mg via INTRAVENOUS
  Administered 2023-03-04: 40 mg via INTRAVENOUS

## 2023-03-04 MED ORDER — ONDANSETRON HCL 4 MG/2ML IJ SOLN
INTRAMUSCULAR | Status: DC | PRN
  Administered 2023-03-04: 4 mg via INTRAVENOUS

## 2023-03-04 MED ORDER — LIDOCAINE 2% (20 MG/ML) 5 ML SYRINGE
INTRAMUSCULAR | Status: AC
  Filled 2023-03-04: qty 5

## 2023-03-04 MED ORDER — AMISULPRIDE (ANTIEMETIC) 5 MG/2ML IV SOLN: 10.0000 mg | Freq: Once | INTRAVENOUS | Status: DC | PRN

## 2023-03-04 MED ORDER — ATROPINE SULFATE 0.4 MG/ML IV SOLN
INTRAVENOUS | Status: DC | PRN
  Administered 2023-03-04: .4 mg via INTRAVENOUS

## 2023-03-04 MED ORDER — ROCURONIUM BROMIDE 10 MG/ML (PF) SYRINGE
PREFILLED_SYRINGE | INTRAVENOUS | Status: AC
Start: 2023-03-04 — End: ?
  Filled 2023-03-04: qty 10

## 2023-03-04 MED ORDER — ATROPINE SULFATE 0.4 MG/ML IV SOLN
INTRAVENOUS | Status: AC
  Filled 2023-03-04: qty 1

## 2023-03-04 MED ORDER — FENTANYL CITRATE (PF) 250 MCG/5ML IJ SOLN
INTRAMUSCULAR | Status: AC
  Filled 2023-03-04: qty 5

## 2023-03-04 MED ORDER — ORAL CARE MOUTH RINSE: 15.0000 mL | Freq: Once | OROMUCOSAL | Status: AC

## 2023-03-04 MED ORDER — PROPOFOL 500 MG/50ML IV EMUL
INTRAVENOUS | Status: DC | PRN
  Administered 2023-03-04: 75 ug/kg/min via INTRAVENOUS

## 2023-03-04 MED ORDER — EPINEPHRINE PF 1 MG/ML IJ SOLN
INTRAMUSCULAR | Status: DC | PRN
  Administered 2023-03-04: .3 mg

## 2023-03-04 MED ORDER — EPINEPHRINE PF 1 MG/ML IJ SOLN
INTRAMUSCULAR | Status: AC
  Filled 2023-03-04: qty 1

## 2023-03-04 MED ORDER — PHENYLEPHRINE HCL-NACL 20-0.9 MG/250ML-% IV SOLN
INTRAVENOUS | Status: DC | PRN
  Administered 2023-03-04: 60 ug/min via INTRAVENOUS

## 2023-03-04 MED ORDER — EPHEDRINE 5 MG/ML INJ
INTRAVENOUS | Status: AC
Start: 2023-03-04 — End: ?
  Filled 2023-03-04: qty 5

## 2023-03-04 MED ORDER — PROPOFOL 10 MG/ML IV BOLUS
INTRAVENOUS | Status: AC
  Filled 2023-03-04: qty 20

## 2023-03-04 MED ORDER — PHENYLEPHRINE 80 MCG/ML (10ML) SYRINGE FOR IV PUSH (FOR BLOOD PRESSURE SUPPORT)
PREFILLED_SYRINGE | INTRAVENOUS | Status: DC | PRN
  Administered 2023-03-04: 80 ug via INTRAVENOUS

## 2023-03-04 MED ORDER — OXYCODONE HCL 5 MG/5ML PO SOLN: 5.0000 mg | Freq: Once | ORAL | Status: DC | PRN

## 2023-03-04 MED ORDER — MIDAZOLAM HCL 2 MG/2ML IJ SOLN
INTRAMUSCULAR | Status: AC
  Filled 2023-03-04: qty 2

## 2023-03-04 MED ORDER — ONDANSETRON HCL 4 MG/2ML IJ SOLN
INTRAMUSCULAR | Status: AC
  Filled 2023-03-04: qty 2

## 2023-03-04 MED ORDER — LIDOCAINE 2% (20 MG/ML) 5 ML SYRINGE
INTRAMUSCULAR | Status: DC | PRN
  Administered 2023-03-04: 60 mg via INTRAVENOUS

## 2023-03-04 SURGICAL SUPPLY — 44 items
ADAPTER BRONCHOSCOPE OLYMPUS (ADAPTER) ×1 IMPLANT
ADAPTER VALVE BIOPSY EBUS (MISCELLANEOUS) IMPLANT
ADPTR VALVE BIOPSY EBUS (MISCELLANEOUS)
BLADE CLIPPER SURG (BLADE) ×1 IMPLANT
BRUSH BIOPSY BRONCH 10 SDTNB (MISCELLANEOUS) IMPLANT
BRUSH SUPERTRAX BIOPSY (INSTRUMENTS) IMPLANT
BRUSH SUPERTRAX NDL-TIP CYTO (INSTRUMENTS) ×1 IMPLANT
CANISTER SUCT 3000ML PPV (MISCELLANEOUS) ×1 IMPLANT
CNTNR URN SCR LID CUP LEK RST (MISCELLANEOUS) ×2 IMPLANT
COVER BACK TABLE 60X90IN (DRAPES) ×1 IMPLANT
FILTER STRAW FLUID ASPIR (MISCELLANEOUS) ×1 IMPLANT
FORCEPS BIOP SUPERTRX PREMAR (INSTRUMENTS) IMPLANT
GAUZE 4X4 16PLY ~~LOC~~+RFID DBL (SPONGE) IMPLANT
GAUZE SPONGE 4X4 12PLY STRL (GAUZE/BANDAGES/DRESSINGS) ×1 IMPLANT
GLOVE SS BIOGEL STRL SZ 7.5 (GLOVE) ×1 IMPLANT
GLOVE SURG SIGNA 7.5 PF LTX (GLOVE) ×1 IMPLANT
GOWN STRL REUS W/ TWL XL LVL3 (GOWN DISPOSABLE) ×1 IMPLANT
KIT CLEAN ENDO COMPLIANCE (KITS) ×1 IMPLANT
KIT ILLUMISITE 180 PROCEDURE (KITS) IMPLANT
KIT ILLUMISITE 90 PROCEDURE (KITS) IMPLANT
KIT TURNOVER KIT B (KITS) ×1 IMPLANT
MARKER SKIN DUAL TIP RULER LAB (MISCELLANEOUS) ×1 IMPLANT
NDL SUPERTRX PREMARK BIOPSY (NEEDLE) IMPLANT
NEEDLE SUPERTRX PREMARK BIOPSY (NEEDLE) IMPLANT
NS IRRIG 1000ML POUR BTL (IV SOLUTION) ×1 IMPLANT
OIL SILICONE PENTAX (PARTS (SERVICE/REPAIRS)) ×1 IMPLANT
PAD ARMBOARD 7.5X6 YLW CONV (MISCELLANEOUS) ×2 IMPLANT
PATCHES PATIENT (LABEL) ×3 IMPLANT
SPONGE T-LAP 18X18 ~~LOC~~+RFID (SPONGE) IMPLANT
SPONGE T-LAP 4X18 ~~LOC~~+RFID (SPONGE) IMPLANT
SYR 20ML ECCENTRIC (SYRINGE) ×1 IMPLANT
SYR 20ML LL LF (SYRINGE) ×2 IMPLANT
SYR 30ML LL (SYRINGE) IMPLANT
SYR 50ML LL SCALE MARK (SYRINGE) ×1 IMPLANT
SYR 5ML LL (SYRINGE) ×1 IMPLANT
SYR TB 1ML LUER SLIP (SYRINGE) IMPLANT
TOWEL GREEN STERILE (TOWEL DISPOSABLE) ×1 IMPLANT
TOWEL GREEN STERILE FF (TOWEL DISPOSABLE) ×1 IMPLANT
TRAP SPECIMEN MUCUS 40CC (MISCELLANEOUS) ×1 IMPLANT
TUBE CONNECTING 20X1/4 (TUBING) ×2 IMPLANT
UNDERPAD 30X36 HEAVY ABSORB (UNDERPADS AND DIAPERS) ×1 IMPLANT
VALVE BIOPSY SINGLE USE (MISCELLANEOUS) ×1 IMPLANT
VALVE SUCTION BRONCHIO DISP (MISCELLANEOUS) ×1 IMPLANT
WATER STERILE IRR 1000ML POUR (IV SOLUTION) ×1 IMPLANT

## 2023-03-04 NOTE — Interval H&P Note (Signed)
History and Physical Interval Note:  03/04/2023 10:24 AM  Terrence Swanson  has presented today for surgery, with the diagnosis of LUNG NODULES.  The various methods of treatment have been discussed with the patient and family. After consideration of risks, benefits and other options for treatment, the patient has consented to  Procedure(s): VIDEO BRONCHOSCOPY WITH ENDOBRONCHIAL NAVIGATION (N/A) as a surgical intervention.  The patient's history has been reviewed, patient examined, no change in status, stable for surgery.  I have reviewed the patient's chart and labs.  Questions were answered to the patient's satisfaction.     [name]

## 2023-03-04 NOTE — Discharge Instructions (Addendum)
Do not drive or engage in heavy physical activity for 24 hours.  You may resume normal activities tomorrow.  You may cough up small amounts of blood over the next few days.  You may use acetaminophen if needed for discomfort.  You may use an over-the-counter cough medication and/ or throat lozenge if needed.  Call [phone] if you develop chest pain, shortness of breath, fever > 101 F or cough up more than a tablespoon of blood.  My office will arrange an appointment next week to discuss results and plan treatment.

## 2023-03-04 NOTE — Op Note (Deleted)
  The note originally documented on this encounter has been moved the the encounter in which it belongs.  

## 2023-03-04 NOTE — Brief Op Note (Signed)
03/04/2023  12:18 PM  PATIENT:  Terrence Swanson  62 y.o. male  PRE-OPERATIVE DIAGNOSIS:  LUNG NODULES  POST-OPERATIVE DIAGNOSIS:  LUNG NODULES  PROCEDURE:  Procedure(s): VIDEO BRONCHOSCOPY WITH ENDOBRONCHIAL NAVIGATION (N/A) Needle aspirations and transbronchial biopsies of right upper lobe nodule  SURGEON:  Surgeons and Role:    * [name], MD - Primary  PHYSICIAN ASSISTANT:   ASSISTANTS: none   ANESTHESIA:   general  EBL:  minimal  BLOOD ADMINISTERED:none  DRAINS: none   LOCAL MEDICATIONS USED:  NONE  SPECIMEN:  Source of Specimen:  RUL nodule  DISPOSITION OF SPECIMEN:  PATHOLOGY  COUNTS:  NO endoscopic  TOURNIQUET:  * No tourniquets in log *  DICTATION: .Other Dictation: Dictation Number -  PLAN OF CARE: Discharge to home after PACU  PATIENT DISPOSITION:  PACU - hemodynamically stable.   Delay start of Pharmacological VTE agent (>24hrs) due to surgical blood loss or risk of bleeding: not applicable

## 2023-03-04 NOTE — Transfer of Care (Signed)
Immediate Anesthesia Transfer of Care Note  Patient: Terrence Swanson  Procedure(s) Performed: VIDEO BRONCHOSCOPY WITH ENDOBRONCHIAL NAVIGATION (Chest)  Patient Location: PACU  Anesthesia Type:General  Level of Consciousness: drowsy  Airway & Oxygen Therapy: Patient Spontanous Breathing and Patient connected to face mask oxygen  Post-op Assessment: Report given to RN and Post -op Vital signs reviewed and stable  Post vital signs: Reviewed and stable  Last Vitals:  Vitals Value Taken Time  BP 116/66 03/04/23 1211  Temp 36.2 C 03/04/23 1211  Pulse 61 03/04/23 1213  Resp 14 03/04/23 1213  SpO2 98 % 03/04/23 1213  Vitals shown include unfiled device data.  Last Pain:  Vitals:   03/04/23 1211  TempSrc:   PainSc: Asleep         Complications: No notable events documented.

## 2023-03-04 NOTE — Anesthesia Procedure Notes (Signed)
Procedure Name: Intubation Date/Time: 03/04/2023 11:16 AM  Performed by: Georgianne Fick D, CRNAPre-anesthesia Checklist: Patient identified, Emergency Drugs available, Suction available and Patient being monitored Patient Re-evaluated:Patient Re-evaluated prior to induction Oxygen Delivery Method: Circle System Utilized Preoxygenation: Pre-oxygenation with 100% oxygen Induction Type: IV induction Ventilation: Mask ventilation without difficulty Laryngoscope Size: Miller, 4 and 3 Grade View: Grade II Tube type: Oral Laser Tube: Cuffed inflated with minimal occlusive pressure - saline Tube size: 8.5 mm Number of attempts: 1 Airway Equipment and Method: Stylet Placement Confirmation: ETT inserted through vocal cords under direct vision, positive ETCO2 and breath sounds checked- equal and bilateral Secured at: 23 cm Tube secured with: Tape Dental Injury: Teeth and Oropharynx as per pre-operative assessment  Comments: Performed by Adrienne Mocha

## 2023-03-04 NOTE — Anesthesia Preprocedure Evaluation (Addendum)
Anesthesia Evaluation  Patient identified by MRN, date of birth, ID band Patient awake    Reviewed: Allergy & Precautions, NPO status , Patient's Chart, lab work & pertinent test results  Airway Mallampati: III  TM Distance: >3 FB Neck ROM: Full    Dental no notable dental hx.    Pulmonary asthma , COPD, former smoker   Pulmonary exam normal        Cardiovascular + Peripheral Vascular Disease  Normal cardiovascular exam  CATH The left ventricular systolic function is normal. LV end diastolic pressure is normal. The left ventricular ejection fraction is 55-65% by visual estimate. 2nd Diag lesion is 20% stenosed. Prox RCA lesion is 10% stenosed.   1.  Left dominant coronary arteries with minimal irregularities and no evidence of obstructive coronary artery disease 2.  Normal LV systolic function and normal left ventricular end-diastolic pressure.   Recommendations: False-positive nuclear stress test. Chest pain seems to be noncardiac.    Neuro/Psych  PSYCHIATRIC DISORDERS Anxiety Depression     Neuromuscular disease    GI/Hepatic Neg liver ROS,GERD  Medicated and Controlled,,  Endo/Other  negative endocrine ROS    Renal/GU negative Renal ROS     Musculoskeletal  (+) Arthritis ,    Abdominal   Peds  Hematology negative hematology ROS (+)   Anesthesia Other Findings LUNG NODULES  Reproductive/Obstetrics                             Anesthesia Physical Anesthesia Plan  ASA: 3  Anesthesia Plan: General   Post-op Pain Management:    Induction: Intravenous  PONV Risk Score and Plan: 2 and Ondansetron, Dexamethasone and Treatment may vary due to age or medical condition  Airway Management Planned: Oral ETT  Additional Equipment:   Intra-op Plan:   Post-operative Plan: Extubation in OR  Informed Consent: I have reviewed the patients History and Physical, chart, labs and  discussed the procedure including the risks, benefits and alternatives for the proposed anesthesia with the patient or authorized representative who has indicated his/her understanding and acceptance.     Dental advisory given  Plan Discussed with: CRNA  Anesthesia Plan Comments:         Anesthesia Quick Evaluation

## 2023-03-04 NOTE — Op Note (Signed)
Terrence Swanson, Terrence Swanson MEDICAL RECORD NO: [id number] ACCOUNT NO: [id number] DATE OF BIRTH: 1961/02/24 FACILITY: [facility] LOCATION: [facility] PHYSICIAN: Salvatore Decent. Dorris Fetch, MD  Operative Report   DATE OF PROCEDURE: 03/04/2023  PREOPERATIVE DIAGNOSIS:  Right upper and middle lobe lung nodules.  POSTOPERATIVE DIAGNOSIS:  Right upper and middle lobe lung nodules.  PROCEDURE:  Electromagnetic navigational bronchoscopy with needle aspirations and transbronchial biopsies of right upper lobe nodule.  SURGEON:  Salvatore Decent.  Dorris Fetch, MD  ASSISTANT:  None  ANESTHESIA:  General.  FINDINGS:  Needle aspirations adequate for diagnostic purposes consistent with non-small cell carcinoma.   CLINICAL NOTE:  [name] is a 62 year old gentleman with a history of tobacco abuse and COPD.  Recently found to have suspicious lung nodules in the right upper and middle lobes on the low-dose CT for lung cancer screening.  Both nodules were hypermetabolic on PET/CT.  There was no evidence of adenopathy.  He was advised to undergo navigational bronchoscopy for biopsy of the upper lobe nodule and possibly the middle lobe nodule depending on findings.  Indications, risks, benefits, and alternatives were discussed in detail with the patient.  He understood this procedure was diagnostic and not therapeutic .   OPERATIVE NOTE:  [name] was brought to the operating room on 03/04/2023.  He had induction of general anesthesia and was intubated.  Sequential compression devices were placed on the calves for DVT prophylaxis.  A Bair Hugger was placed for active warming.  Planning for the navigational bronchoscopy was done on the console as the patient was being induced.  Plans were created for both the upper lobe and the middle lobe nodules.   The bronchoscope was inserted via the endotracheal tube.  There was normal endobronchial anatomy with no endobronchial lesions to the level of the subsegmental bronchi.  There were some thick  clear secretions which were cleared with saline.  The locatable guide for navigation was placed.  Registration was performed.  There was good correlation of the video and virtual bronchoscopy.  The bronchoscope then was directed to the right upper lobe bronchus and the appropriate subsegmental bronchus was cannulated.  The catheter was manipulated and came into close proximity to the nodule.  Local registration was performed and the catheter was positioned directly on center with the nodule.  Needle aspirations then were performed.  While these were being examined with A quick prep, the first specimen was deemed adequate for diagnosis.  Multiple transbronchial biopsies were taken.  All sampling was done with fluoroscopy.  Given the positive diagnosis with the upper lobe nodule, it was not felt that the high risk associated with attempting to biopsy the middle lobe nodule would be justified.  The catheter was advanced into the vicinity of the middle lobe nodule, although slightly offline.  With fluoroscopy, there was going to be very limited margin for safety and therefore the nodule was not biopsied.  The patient was then extubated in the operating room and taken to the post-anesthetic care unit in good condition.  The total fluoroscopy time was 57 seconds and the total dose was 16.3 mGy.     SUJ D: 03/04/2023 4:35:59 pm T: 03/04/2023 10:14:00 pm  JOB: [id number]/ [id number]

## 2023-03-04 NOTE — Anesthesia Postprocedure Evaluation (Signed)
Anesthesia Post Note  Patient: Terrence Swanson  Procedure(s) Performed: VIDEO BRONCHOSCOPY WITH ENDOBRONCHIAL NAVIGATION (Chest)     Patient location during evaluation: PACU Anesthesia Type: General Level of consciousness: awake Pain management: pain level controlled Vital Signs Assessment: post-procedure vital signs reviewed and stable Respiratory status: spontaneous breathing, nonlabored ventilation and respiratory function stable Cardiovascular status: blood pressure returned to baseline and stable Postop Assessment: no apparent nausea or vomiting Anesthetic complications: no   No notable events documented.  Last Vitals:  Vitals:   03/04/23 1230 03/04/23 1245  BP: (!) 107/55 122/83  Pulse: 66 63  Resp: 20 12  Temp:  36.4 C  SpO2: 96% 94%    Last Pain:  Vitals:   03/04/23 1245  TempSrc:   PainSc: 0-No pain                 Maisley Hainsworth P Gerrald Basu

## 2023-03-05 ENCOUNTER — Encounter (HOSPITAL_COMMUNITY): Payer: Self-pay | Admitting: Thoracic Surgery (Cardiothoracic Vascular Surgery)

## 2023-03-07 LAB — CYTOLOGY - NON PAP

## 2023-03-07 LAB — SURGICAL PATHOLOGY

## 2023-03-10 ENCOUNTER — Ambulatory Visit (INDEPENDENT_AMBULATORY_CARE_PROVIDER_SITE_OTHER): Payer: Medicare Other | Admitting: Thoracic Surgery (Cardiothoracic Vascular Surgery)

## 2023-03-10 VITALS — BP 167/64 | HR 61 | Resp 20 | Wt 192.0 lb

## 2023-03-10 DIAGNOSIS — R911 Solitary pulmonary nodule: Secondary | ICD-10-CM

## 2023-03-10 NOTE — Progress Notes (Signed)
[address]       [place] [address]             [phone]       HPI: [name] returns to discuss the results of his bronchoscopy.  Alexzavier Noguez is a 62 year old man with a history of tobacco abuse, COPD, asthma, reflux, peripheral vascular disease, iliac stents, anxiety, depression, herniated disc, diverticulosis, and newly diagnosed adenocarcinoma of the lung.  Found to have suspicious lung nodules on a low-dose CT for lung cancer screening in September.  PET/CT showed nodules in the right upper and middle lobes were both hypermetabolic.  There was no adenopathy.  I did a navigational bronchoscopy.  Biopsies of the upper lobe nodule showed adenocarcinoma.  The middle lobe nodule was not biopsied because of its very peripheral location and surrounding emphysema with a high risk of a pneumothorax.  He said he had a sore throat for a couple of days later and did have a cough.  No issues currently.  Very anxious about what he is going to do.   Past Medical History:  Diagnosis Date   Anxiety    Arthritis    Asthma    Complication of anesthesia    difficult to wake up after anesthesia   COPD (chronic obstructive pulmonary disease) (HCC)    Depression    Diverticulosis    GERD (gastroesophageal reflux disease)    Herniated lumbar intervertebral disc    Labral tear of shoulder    left shoulder   Peripheral vascular disease (HCC)     Current Outpatient Medications  Medication Sig Dispense Refill   acetaminophen (TYLENOL) 500 MG tablet Take 500-1,000 mg by mouth every 6 (six) hours as needed for moderate pain (pain score 4-6).     aspirin EC 81 MG tablet Take 1 tablet (81 mg total) by mouth daily. (Patient taking differently: Take 81 mg by mouth at bedtime.)     atorvastatin (LIPITOR) 40 MG tablet Take 1 tablet (40 mg total) by mouth daily. 30 tablet 10   clonazePAM (KLONOPIN) 1 MG tablet Take 1 mg by mouth 2 (two) times daily as needed for anxiety.      hydrocortisone cream 1 % Apply 1 application topically daily as needed for itching.     pantoprazole (PROTONIX) 40 MG tablet Take 40 mg by mouth daily.     No current facility-administered medications for this visit.    Impression: Terrence Swanson is a 62 year old man with a history of tobacco abuse, COPD, asthma, reflux, peripheral vascular disease, iliac stents, anxiety, depression, herniated disc, diverticulosis, and newly diagnosed adenocarcinoma of the lung.  Adenocarcinoma right upper lobe and likely right middle lobe.  T4, N0, stage IIIa due to 2 separate nodules in different lobes of the same lung.  Had a long discussion with Mr. Mitchel regarding the treatment of these nodules.  He does have adequate pulmonary function to tolerate resection.  That would require a right upper lobectomy for sure.  Likely a right middle lobectomy as well.  He does have some significant emphysema and also some pulmonary fibrosis.  Emphysema is predominantly in the upper lobe.  We discussed the general nature of the procedure including the incisions that would be used, the use of the surgical robot, the need for drainage tube postoperatively, the expected hospital stay, and the overall recovery.  I informed him of the indications, risks, benefits, and alternatives.  He understands the risks include, but not limited  to death, MI, DVT, PE, bleeding, possible need for transfusion, infection, prolonged air leak, cardiac arrhythmias, as well as possibility of other unforeseeable complications.  He has met with Dr. Langston Masker of radiation oncology.  He would potentially be a candidate for stereotactic radiation to the 2 lesions as well as an alternative to surgery.  Mr. Quander is aware that gold standard for treatment of surgery.  That gives him the best chance of a cure, but there is no guarantee.  It is entirely possible that he could be cured with stereotactic radiation as well.  He has social issues which involves having to care  for his wife who is apparently completely disabled due to strokes.  Therefore he thinks he may have to do radiation oncology as there would not be sufficient support for her while he is recovering.  Plan: Current plan is for stereotactic radiation If he were to change his mind and want a pursue surgery we can arrange that.  I spent over 20 minutes in review of records, images, and in consultation with Mr. Nakayama today.  [name], MD [facility] [phone]

## 2023-03-11 ENCOUNTER — Inpatient Hospital Stay: Payer: Medicare Other | Attending: Oncology | Admitting: Oncology

## 2023-03-11 VITALS — BP 138/75 | HR 65 | Temp 98.1°F | Resp 16 | Wt 197.8 lb

## 2023-03-11 DIAGNOSIS — Z87891 Personal history of nicotine dependence: Secondary | ICD-10-CM | POA: Insufficient documentation

## 2023-03-11 DIAGNOSIS — C3411 Malignant neoplasm of upper lobe, right bronchus or lung: Secondary | ICD-10-CM | POA: Diagnosis present

## 2023-03-11 DIAGNOSIS — C3490 Malignant neoplasm of unspecified part of unspecified bronchus or lung: Secondary | ICD-10-CM

## 2023-03-11 NOTE — Progress Notes (Signed)
Hematology-Oncology Clinic Note  Lianne Moris, [place]   Reason for Referral: Right lung nodules  Oncology History  Lung nodule  12/09/2022 Imaging   Lung-RADS 4B, suspicious. Two new irregular solid right pulmonary nodules measuring 12.9 mm in volume derived mean diameter in the right middle lobe and 11.4 mm in volume derived mean diameter in the right upper lobe, suspicious for synchronous primary bronchogenic malignancies.    01/20/2023 PET scan   Within the RIGHT upper lobe solid nodule measuring 9 mm on image 71 has intense metabolic activity for size with SUV max equal 4.1.   Second nodule within the posterior aspect of the RIGHT middle lobe measures 11 mm (image 95) and also with intense metabolic activity SUV max equal 5.5   No hypermetabolic mediastinal lymph nodes.  IMPRESSION: 1. Two hypermetabolic pulmonary nodules in the RIGHT lung are concerning for primary synchronous bronchogenic carcinoma. 2. No evidence of metastatic adenopathy in the chest. 3. No evidence distant metastatic disease.   02/17/2023 Initial Diagnosis   Lung nodule     History of Presenting Illness: KAHNE STANKE 62 y.o. male is referred to oncology for newly found right lung nodules on routine low-dose CT scan for lung cancer screening.  Patient was seen by Dr. Orson Aloe with cardio thoracic surgery yesterday after bronchoscopy but unfortunately which revealed adenocarcinoma.  They discussed surgery options which likely would require right middle and upper lobe lobectomy.   He met with Dr. Langston Masker with [facility] radiation oncology who thinks he is a great candidate for stereotactic radiation as a alternative to surgery.   Mr. Mcmorris reports that he is a sole caregiver of his wife who is suffered several strokes and he does not have anybody else to help should he need surgery.  He believes he will likely go the radiation route and will make a decision this weekend.  He is highly anxious and feels like there has  been working medication between the cancer center and him.  He has called up here on multiple occasions without any response.  States she was supposed to be referred to radiation oncology but apparently it was not done and he ended up self referring and walking over to the clinic in [place].  He is here to review his most recent MRI.   Overall, he is doing well.  Denies any nausea or vomiting.  Occasional shortness of breath but no coughing.  Has some sinus congestion.  Appetite is at 100% energy levels are 50%.  Has had an intermittent headache and dizziness at times.  Medical History: Past Medical History:  Diagnosis Date   Anxiety    Arthritis    Asthma    Complication of anesthesia    difficult to wake up after anesthesia   COPD (chronic obstructive pulmonary disease) (HCC)    Depression    Diverticulosis    GERD (gastroesophageal reflux disease)    Herniated lumbar intervertebral disc    Labral tear of shoulder    left shoulder   Peripheral vascular disease (HCC)     Surgical history: Past Surgical History:  Procedure Laterality Date   ABDOMINAL AORTAGRAM N/A 07/24/2014   Procedure: ABDOMINAL Ronny Flurry;  Surgeon: Iran Ouch, MD;  Location: [facility] CATH LAB;  Service: Cardiovascular;  Laterality: N/A;   ABDOMINAL AORTOGRAM N/A 07/28/2016   Procedure: Abdominal Aortogram;  Surgeon: Iran Ouch, MD;  Location: [facility] INVASIVE CV LAB;  Service: Cardiovascular;  Laterality: N/A;   INGUINAL HERNIA REPAIR Left 03/18/2021   Procedure:  LEFT INGUINAL HERNIA REAPAIR W/ MESH (ADULT);  Surgeon: Franky Macho, MD;  Location: [facility] ORS;  Service: General;  Laterality: Left;   LEFT HEART CATH AND CORONARY ANGIOGRAPHY N/A 07/27/2019   Procedure: LEFT HEART CATH AND CORONARY ANGIOGRAPHY;  Surgeon: Iran Ouch, MD;  Location: [facility] INVASIVE CV LAB;  Service: Cardiovascular;  Laterality: N/A;   PERCUTANEOUS STENT INTERVENTION N/A 07/24/2014   Procedure: PERCUTANEOUS STENT INTERVENTION;  Surgeon:  Iran Ouch, MD;  Location: [facility] CATH [facility];  Service: Cardiovascular;  Laterality: N/A;  rt and left common iliacs   PERIPHERAL VASCULAR INTERVENTION Bilateral 07/28/2016   Procedure: Peripheral Vascular Intervention;  Surgeon: Iran Ouch, MD;  Location: [facility] INVASIVE CV LAB;  Service: Cardiovascular;  Laterality: Bilateral;  COMMON ILIACS   VIDEO BRONCHOSCOPY WITH ENDOBRONCHIAL NAVIGATION N/A 03/04/2023   Procedure: VIDEO BRONCHOSCOPY WITH ENDOBRONCHIAL NAVIGATION;  Surgeon: [name], MD;  Location: [facility] OR;  Service: Thoracic;  Laterality: N/A;    Social History: Social History   Socioeconomic History   Marital status: Divorced    Spouse name: Not on file   Number of children: 2   Years of education: Not on file   Highest education level: Not on file  Occupational History   Not on file  Tobacco Use   Smoking status: Former    Current packs/day: 0.00    Average packs/day: 1.5 packs/day for 45.0 years (67.5 ttl pk-yrs)    Types: Cigarettes    Start date: 08/27/1973    Quit date: 08/28/2018    Years since quitting: 4.5   Smokeless tobacco: Never  Vaping Use   Vaping status: Never Used  Substance and Sexual Activity   Alcohol use: Not Currently   Drug use: No   Sexual activity: Not on file  Other Topics Concern   Not on file  Social History Narrative   Divorced but has lived with ex wife for the past 18 months and takes care of her (due to her having a stroke) 03/02/23   Social Drivers of Corporate investment banker Strain: Not on file  Food Insecurity: Not on file  Transportation Needs: Not on file  Physical Activity: Not on file  Stress: Not on file  Social Connections: Not on file  Intimate Partner Violence: Not on file    Family History: Family History  Problem Relation Age of Onset   Diabetes Mother    Cancer Father        Throat    Allergies:  has no known allergies.  Medications:  Current Outpatient Medications  Medication Sig Dispense Refill    acetaminophen (TYLENOL) 500 MG tablet Take 500-1,000 mg by mouth every 6 (six) hours as needed for moderate pain (pain score 4-6).     aspirin EC 81 MG tablet Take 1 tablet (81 mg total) by mouth daily. (Patient taking differently: Take 81 mg by mouth at bedtime.)     atorvastatin (LIPITOR) 40 MG tablet Take 1 tablet (40 mg total) by mouth daily. 30 tablet 10   clonazePAM (KLONOPIN) 1 MG tablet Take 1 mg by mouth 2 (two) times daily as needed for anxiety.     hydrocortisone cream 1 % Apply 1 application topically daily as needed for itching.     pantoprazole (PROTONIX) 40 MG tablet Take 40 mg by mouth daily.     No current facility-administered medications for this visit.    Review of Systems: Review of Systems  HENT:  Positive for sinus pain.   Respiratory:  Positive for shortness of breath. Negative for cough.   Gastrointestinal:  Negative for nausea and vomiting.  Neurological:  Positive for dizziness and headaches.  Psychiatric/Behavioral:  The patient is nervous/anxious.     Physical Examination: ECOG PERFORMANCE STATUS: 0 - Asymptomatic  There were no vitals filed for this visit.  There were no vitals filed for this visit.  Physical Exam Vitals reviewed.  Constitutional:      Appearance: Normal appearance.  Cardiovascular:     Rate and Rhythm: Normal rate and regular rhythm.  Pulmonary:     Effort: Pulmonary effort is normal.     Breath sounds: Normal breath sounds.  Abdominal:     General: Bowel sounds are normal.     Palpations: Abdomen is soft.  Musculoskeletal:        General: No swelling. Normal range of motion.  Neurological:     Mental Status: He is alert and oriented to person, place, and time. Mental status is at baseline.     Laboratory Data: I have reviewed the data as listed Lab Results  Component Value Date   WBC 10.2 03/02/2023   HGB 14.9 03/02/2023   HCT 45.0 03/02/2023   MCV 88.4 03/02/2023   PLT 224 03/02/2023    Radiographic  Studies: I reviewed the following images and agree with the findings. EXAM: NUCLEAR MEDICINE PET SKULL BASE TO THIGH   TECHNIQUE: 8.9 mCi F-18 FDG was injected intravenously. Full-ring PET imaging was performed from the skull base to thigh after the radiotracer. CT data was obtained and used for attenuation correction and anatomic localization.   Fasting blood glucose: 94 mg/dl   COMPARISON:  None Available.   FINDINGS: NECK: No hypermetabolic lymph nodes in the neck.   Incidental CT findings: None.   CHEST: Within the RIGHT upper lobe solid nodule measuring 9 mm on image 71 has intense metabolic activity for size with SUV max equal 4.1.   Second nodule within the posterior aspect of the RIGHT middle lobe measures 11 mm (image 95) and also with intense metabolic activity SUV max equal 5.5   No hypermetabolic mediastinal lymph nodes.   There is mild metabolic activity associated with RIGHT lobe atelectasis without focal lesion.   Incidental CT findings: None.   ABDOMEN/PELVIS: No abnormal hypermetabolic activity within the liver, pancreas, adrenal glands, or spleen. No hypermetabolic lymph nodes in the abdomen or pelvis.   Incidental CT findings: Atherosclerotic calcification of the aorta.   SKELETON: No focal hypermetabolic activity to suggest skeletal metastasis.   Incidental CT findings: None   IMPRESSION: 1. Two hypermetabolic pulmonary nodules in the RIGHT lung are concerning for primary synchronous bronchogenic carcinoma. 2. No evidence of metastatic adenopathy in the chest. 3. No evidence distant metastatic disease.  ASSESSMENT & PLAN:  Patient is a 62 year old male referred for recent diagnosis of lung nodules is here to review most recent MRI results.  1. Malignant neoplasm of unspecified part of unspecified bronchus or lung (HCC) (Primary) -Found to have suspicious lung nodule on low-dose CT scan for lung cancer screening in September. -PET scan  showed nodules in right upper and middle lobes were both hypermetabolic.  No adenopathy. -Navigational bronchoscopy from 03/04/2023 showed adenocarcinoma. -She has met with Dr. Langston Masker (radiation oncology), Dr. Robby Sermon (cardiothoracic surgery) and Dr. Anders Simmonds for medical oncology. -It sounds like he is leaning towards radiation oncology and stereotactic radiation which would require less treatments. -He is the sole caretaker of his wife who requires quite a bit of help after  several strokes. -We reviewed MRI from 02/28/23 which showed no evidence of intracranial metastatic disease. -Given he is still on the fence regarding treatment options, I have asked that he call us should he wish to review chemo versus immunotherapy options.  At this time, he would like to spend the weekend reviewing his options although he is leaning toward radiation.  -Will go ahead and schedule him for a 42-month follow-up with CT scan and to see Dr. Anders Simmonds back.   - CT Chest W Contrast; Future   No problem-specific Assessment & Plan notes found for this encounter.    No orders of the defined types were placed in this encounter.   The total time spent in the appointment was 30 minutes encounter with patients including review of chart and various tests results, discussions about plan of care and coordination of care plan   All questions were answered. The patient knows to call the clinic with any problems, questions or concerns. No barriers to learning was detected.  Mauro Kaufmann, NP 12/13/202410:19 AM

## 2023-03-15 ENCOUNTER — Other Ambulatory Visit: Payer: Self-pay

## 2023-03-15 DIAGNOSIS — C3490 Malignant neoplasm of unspecified part of unspecified bronchus or lung: Secondary | ICD-10-CM

## 2023-03-17 ENCOUNTER — Other Ambulatory Visit: Payer: Self-pay

## 2023-03-18 NOTE — Progress Notes (Signed)
The proposed treatment discussed in conference is for discussion purpose only and is not a binding recommendation.  The patients have not been physically examined, or presented with their treatment options.  Therefore, final treatment plans cannot be decided.  

## 2023-03-24 NOTE — Progress Notes (Signed)
Thoracic Location of Tumor / Histology: Right Lung Upper/Middle Lobe  Biopsies  03/04/2023 Dr. Charlett Lango Navigational Bronchoscopy: Biopsies of the upper lobe nodule showed adenocarcinoma. The middle lobe nodule was not biopsied because of its very peripheral location and surrounding emphysema with a high risk of a pneumothorax.    03/02/2023 Dr. Charlett Lango CT Super D Chest without Contrast CLINICAL DATA: Lung nodule * Tracking Code: BO *   IMPRESSION: 1. Unchanged spiculated nodule of the anterior right upper lobe measuring 1.0 x 0.9 cm. 2. Unchanged lobulated nodule of the anterior inferolateral segment right middle lobe measuring 1.4 x 1.2 cm. 3. Both of these nodules previously FDG avid and consistent with small adenocarcinomas. 4. No evidence of lymphadenopathy or metastatic disease in the chest. 5. Emphysema and diffuse bilateral bronchial wall thickening. 6. Coronary artery disease. Aortic Atherosclerosis (ICD10-I70.0) and Emphysema (ICD10-J43.9).   03/02/2023 Dr. Charlett Lango DG Chest 2 View CLINICAL DATA: Preop for bronchoscopy.   IMPRESSION: Emphysema. Right upper and middle lobe nodules on same date chest CT are not well seen by radiograph.   02/28/2023 Dr. Cindie Crumbly MR Brain with/without Contrast CLINICAL DATA:  Provided history: Malignant neoplasm of unspecified part of unspecified bronchus or lung. Non-small cell lung cancer, staging.   IMPRESSION: 1. No evidence of intracranial metastatic disease. 2. Small focus of asymmetric enhancement within the right internal auditory canal. While this could reflect vascular enhancement, an internal auditory canal protocol brain MRI (with and without contrast) is recommended to exclude a vestibular schwannoma. Of note, a history of recurrent vertigo was provided for the prior brain MRI of 06/10/2022. 3. Chronic small vessel ischemic changes within the cerebral white matter, mild but  greater than expected for age. Findings are similar to the prior MRI.   01/20/2023 Dr. Fara Chute NM PET Image Initial (PI) Skull Base to Thigh CLINICAL DATA: Initial treatment strategy for pulmonary nodule.   IMPRESSION: 1. Two hypermetabolic pulmonary nodules in the RIGHT lung are concerning for primary synchronous bronchogenic carcinoma. 2. No evidence of metastatic adenopathy in the chest. 3. No evidence distant metastatic disease.   Past/Anticipated interventions by cardiothoracic surgery, if any:   03/10/2023 Dr. Charlett Lango   Past/Anticipated interventions by medical oncology, if any:  02/21/2023 Dr. Cindie Crumbly    Tobacco/Marijuana/Snuff/ETOH use: Former smoker, no snuff, drug,or alcohol use.  Signs/Symptoms Weight changes, if any:  No Respiratory complaints, if any: Yes, with exertion Hemoptysis, if any: No Pain issues, if any:  0/10  SAFETY ISSUES: Prior radiation? No Pacemaker/ICD?  No Possible current pregnancy?  Male Is the patient on methotrexate? No  Current Complaints / other details:

## 2023-03-28 ENCOUNTER — Encounter: Payer: Self-pay | Admitting: Radiation Oncology

## 2023-03-28 ENCOUNTER — Ambulatory Visit
Admission: RE | Admit: 2023-03-28 | Discharge: 2023-03-28 | Disposition: A | Discharge status: Home / Self Care | Payer: Medicare Other | Source: Ambulatory Visit | Attending: Radiation Oncology | Admitting: Radiation Oncology

## 2023-03-28 VITALS — BP 137/79 | HR 67 | Temp 97.8°F | Resp 18 | Ht 72.0 in | Wt 191.2 lb

## 2023-03-28 DIAGNOSIS — I6782 Cerebral ischemia: Secondary | ICD-10-CM | POA: Diagnosis not present

## 2023-03-28 DIAGNOSIS — I2510 Atherosclerotic heart disease of native coronary artery without angina pectoris: Secondary | ICD-10-CM | POA: Diagnosis not present

## 2023-03-28 DIAGNOSIS — C342 Malignant neoplasm of middle lobe, bronchus or lung: Secondary | ICD-10-CM | POA: Insufficient documentation

## 2023-03-28 DIAGNOSIS — J432 Centrilobular emphysema: Secondary | ICD-10-CM | POA: Diagnosis not present

## 2023-03-28 DIAGNOSIS — Z79899 Other long term (current) drug therapy: Secondary | ICD-10-CM | POA: Insufficient documentation

## 2023-03-28 DIAGNOSIS — I739 Peripheral vascular disease, unspecified: Secondary | ICD-10-CM | POA: Insufficient documentation

## 2023-03-28 DIAGNOSIS — I700 Atherosclerosis of aorta: Secondary | ICD-10-CM | POA: Insufficient documentation

## 2023-03-28 DIAGNOSIS — M129 Arthropathy, unspecified: Secondary | ICD-10-CM | POA: Diagnosis not present

## 2023-03-28 DIAGNOSIS — C3411 Malignant neoplasm of upper lobe, right bronchus or lung: Secondary | ICD-10-CM | POA: Diagnosis not present

## 2023-03-28 DIAGNOSIS — Z801 Family history of malignant neoplasm of trachea, bronchus and lung: Secondary | ICD-10-CM | POA: Insufficient documentation

## 2023-03-28 DIAGNOSIS — Z87891 Personal history of nicotine dependence: Secondary | ICD-10-CM | POA: Insufficient documentation

## 2023-03-28 DIAGNOSIS — Z7982 Long term (current) use of aspirin: Secondary | ICD-10-CM | POA: Insufficient documentation

## 2023-03-28 DIAGNOSIS — J45909 Unspecified asthma, uncomplicated: Secondary | ICD-10-CM | POA: Diagnosis not present

## 2023-03-28 DIAGNOSIS — K219 Gastro-esophageal reflux disease without esophagitis: Secondary | ICD-10-CM | POA: Insufficient documentation

## 2023-03-28 NOTE — Progress Notes (Signed)
Radiation Oncology         [phone] ________________________________  Initial outpatient Consultation  Name: Terrence Swanson MRN: [id number]  Date of Service: 03/28/2023 DOB: 05/17/1960  WU:[id number], Denny Peon, PA-C  Doreatha Massed, MD   REFERRING PHYSICIAN: Doreatha Massed, MD  DIAGNOSIS: 62 y/o man with synchronous early stage lung cancers:  Stage IA1 NSCLC of the RUL and Stage IA2 NSCLC RML.     ICD-10-CM   1. Malignant neoplasm of right upper lobe of lung (HCC)  C34.11     2. Cancer of middle lobe of lung (HCC)  C34.2       HISTORY OF PRESENT ILLNESS: Terrence Swanson is a 62 y.o. male seen at the request of Dr. Dorris Fetch. He is a previous smoker with a 67.5 pack year history, having quit smoking approximately 5 years ago. He has been on lung cancer screening with pulmonology and had a low-dose CT for lung cancer screening in September 2024 which showed suspicious nodules in the right upper lobe and right middle lobe. There was also evidence of emphysema and probably some pulmonary fibrosis as well. PET/CT on 01/20/23 showed the 9 mm RUL and 11 mm RML nodules were both hypermetabolic but there was no evidence of mediastinal or hilar adenopathy. He was referred to Dr. Dorris Fetch on 02/17/23. Because the upper lobe nodule is central and not amenable to a wedge resection Dr. Dorris Fetch recommended starting with a navigational bronchoscopy to sample the right upper lobe nodule to have a diagnosis prior to attempting resection.  The patient was in agreement and had the procedure on 03/04/23.  He was unable to sample the middle lobe nodule secondary to it's peripheral location with surrounding emphysema, felt to be high risk for pneumothorax. 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   He met back with Dr. Dorris Fetch on 03/10/23 to discuss pathology and treatment options. He has pathology proven adenocarcinoma in the right upper lobe and a  high suspicion for the same in the right middle lobe that was not sampled due to location.  This would indicate either T4, N0, stage IIIa disease due to 2 separate nodules in different lobes of the same lung versus synchronous primary stage I cancers in separate lobes. He previously met with Dr. Langston Masker in radiation oncology at [facility] who also agreed that if he was not a surgical candidate, or was not interested in surgery, he would be a good candidate for SBRT. Because he is the primary caregiver for his wife that is completely disabled due to multiple strokes, and his son that has multiple medical issues as well, he is leaning towards proceeding with stereotactic body radiotherapy (SBRT). He has therefore been kindly referred to [place] today to discuss the radiotherapy options.   PREVIOUS RADIATION THERAPY: No  PAST MEDICAL HISTORY:  Past Medical History:  Diagnosis Date   Anxiety    Arthritis    Asthma    Complication of anesthesia    difficult to wake up after anesthesia   COPD (chronic obstructive pulmonary disease) (HCC)    Depression    Diverticulosis    GERD (gastroesophageal reflux disease)    Herniated lumbar intervertebral disc    Labral tear of shoulder    left shoulder   Peripheral vascular disease (HCC)       PAST SURGICAL HISTORY: Past Surgical History:  Procedure Laterality Date   ABDOMINAL AORTAGRAM N/A 07/24/2014   Procedure: ABDOMINAL Ronny Flurry;  Surgeon: Iran Ouch,  MD;  Location: [facility] CATH LAB;  Service: Cardiovascular;  Laterality: N/A;   ABDOMINAL AORTOGRAM N/A 07/28/2016   Procedure: Abdominal Aortogram;  Surgeon: Iran Ouch, MD;  Location: [facility] INVASIVE CV LAB;  Service: Cardiovascular;  Laterality: N/A;   INGUINAL HERNIA REPAIR Left 03/18/2021   Procedure: LEFT INGUINAL HERNIA REAPAIR W/ MESH (ADULT);  Surgeon: Franky Macho, MD;  Location: [facility] ORS;  Service: General;  Laterality: Left;   LEFT HEART CATH AND CORONARY ANGIOGRAPHY N/A 07/27/2019    Procedure: LEFT HEART CATH AND CORONARY ANGIOGRAPHY;  Surgeon: Iran Ouch, MD;  Location: [facility] INVASIVE CV LAB;  Service: Cardiovascular;  Laterality: N/A;   PERCUTANEOUS STENT INTERVENTION N/A 07/24/2014   Procedure: PERCUTANEOUS STENT INTERVENTION;  Surgeon: Iran Ouch, MD;  Location: [facility] CATH [facility];  Service: Cardiovascular;  Laterality: N/A;  rt and left common iliacs   PERIPHERAL VASCULAR INTERVENTION Bilateral 07/28/2016   Procedure: Peripheral Vascular Intervention;  Surgeon: Iran Ouch, MD;  Location: [facility] INVASIVE CV LAB;  Service: Cardiovascular;  Laterality: Bilateral;  COMMON ILIACS   VIDEO BRONCHOSCOPY WITH ENDOBRONCHIAL NAVIGATION N/A 03/04/2023   Procedure: VIDEO BRONCHOSCOPY WITH ENDOBRONCHIAL NAVIGATION;  Surgeon: [name], MD;  Location: [facility] OR;  Service: Thoracic;  Laterality: N/A;    FAMILY HISTORY:  Family History  Problem Relation Age of Onset   Diabetes Mother    Cancer Father        Throat    SOCIAL HISTORY:  Social History   Socioeconomic History   Marital status: Divorced    Spouse name: Not on file   Number of children: 2   Years of education: Not on file   Highest education level: Not on file  Occupational History   Not on file  Tobacco Use   Smoking status: Former    Current packs/day: 0.00    Average packs/day: 1.5 packs/day for 45.0 years (67.5 ttl pk-yrs)    Types: Cigarettes    Start date: 08/27/1973    Quit date: 08/28/2018    Years since quitting: 4.5   Smokeless tobacco: Never  Vaping Use   Vaping status: Never Used  Substance and Sexual Activity   Alcohol use: Not Currently   Drug use: No   Sexual activity: Not on file  Other Topics Concern   Not on file  Social History Narrative   Divorced but has lived with ex wife for the past 18 months and takes care of her (due to her having a stroke) 03/02/23   Social Drivers of Health   Financial Resource Strain: Not on file  Food Insecurity: Food Insecurity Present  (03/28/2023)   Hunger Vital Sign    Worried About Running Out of Food in the Last Year: Sometimes true    Ran Out of Food in the Last Year: Sometimes true  Transportation Needs: No Transportation Needs (03/28/2023)   PRAPARE - Administrator, Civil Service (Medical): No    Lack of Transportation (Non-Medical): No  Physical Activity: Not on file  Stress: Not on file  Social Connections: Not on file  Intimate Partner Violence: Not At Risk (03/28/2023)   Humiliation, Afraid, Rape, and Kick questionnaire    Fear of Current or Ex-Partner: No    Emotionally Abused: No    Physically Abused: No    Sexually Abused: No    ALLERGIES: Patient has no known allergies.  MEDICATIONS:  Current Outpatient Medications  Medication Sig Dispense Refill   aspirin EC 81 MG tablet Take 1 tablet (  81 mg total) by mouth daily. (Patient taking differently: Take 81 mg by mouth at bedtime.)     atorvastatin (LIPITOR) 40 MG tablet Take 1 tablet (40 mg total) by mouth daily. 30 tablet 10   clonazePAM (KLONOPIN) 1 MG tablet Take 1 mg by mouth 2 (two) times daily as needed for anxiety.     pantoprazole (PROTONIX) 40 MG tablet Take 40 mg by mouth daily.     acetaminophen (TYLENOL) 500 MG tablet Take 500-1,000 mg by mouth every 6 (six) hours as needed for moderate pain (pain score 4-6).     hydrocortisone cream 1 % Apply 1 application topically daily as needed for itching.     No current facility-administered medications for this encounter.    REVIEW OF SYSTEMS:  On review of systems, the patient reports that he is doing well overall. he denies any chest pain, shortness of breath, cough, fevers, chills, night sweats, unintended weight changes. he denies any bowel or bladder disturbances, and denies abdominal pain, nausea or vomiting. he denies any new musculoskeletal or joint aches or pains. A complete review of systems is obtained and is otherwise negative.    PHYSICAL EXAM:  Wt Readings from Last 3  Encounters:  03/28/23 191 lb 4 oz (86.8 kg)  03/11/23 197 lb 12 oz (89.7 kg)  03/10/23 192 lb (87.1 kg)   Temp Readings from Last 3 Encounters:  03/28/23 97.8 F (36.6 C) (Temporal)  03/11/23 98.1 F (36.7 C) (Oral)  03/04/23 97.6 F (36.4 C)   BP Readings from Last 3 Encounters:  03/28/23 137/79  03/11/23 138/75  03/10/23 (!) 167/64   Pulse Readings from Last 3 Encounters:  03/28/23 67  03/11/23 65  03/10/23 61   Pain Assessment Pain Score: 0-No pain/10  In general this is a well appearing man in no acute distress. He's alert and oriented x4 and appropriate throughout the examination. Cardiopulmonary assessment is negative for acute distress and he exhibits normal effort.   KPS = 100  100 - Normal; no complaints; no evidence of disease. 90   - Able to carry on normal activity; minor signs or symptoms of disease. 80   - Normal activity with effort; some signs or symptoms of disease. 63   - Cares for self; unable to carry on normal activity or to do active work. 60   - Requires occasional assistance, but is able to care for most of his personal needs. 50   - Requires considerable assistance and frequent medical care. 40   - Disabled; requires special care and assistance. 30   - Severely disabled; hospital admission is indicated although death not imminent. 20   - Very sick; hospital admission necessary; active supportive treatment necessary. 10   - Moribund; fatal processes progressing rapidly. 0     - Dead  Karnofsky DA, Abelmann WH, Craver LS and Burchenal [facility] [phone]) The use of the nitrogen mustards in the palliative treatment of carcinoma: with particular reference to bronchogenic carcinoma Cancer 1 634-56  LABORATORY DATA:  Lab Results  Component Value Date   WBC 10.2 03/02/2023   HGB 14.9 03/02/2023   HCT 45.0 03/02/2023   MCV 88.4 03/02/2023   PLT 224 03/02/2023   Lab Results  Component Value Date   NA 140 03/02/2023   K 3.6 03/02/2023   CL 106 03/02/2023    CO2 26 03/02/2023   Lab Results  Component Value Date   ALT 19 03/02/2023   AST 23 03/02/2023   ALKPHOS  92 03/02/2023   BILITOT 0.6 03/02/2023     RADIOGRAPHY: CT Super D Chest Wo Contrast Result Date: 03/14/2023 CLINICAL DATA:  Lung nodule * Tracking Code: BO * EXAM: CT CHEST WITHOUT CONTRAST TECHNIQUE: Multidetector CT imaging of the chest was performed using thin slice collimation for electromagnetic bronchoscopy planning purposes, without intravenous contrast. RADIATION DOSE REDUCTION: This exam was performed according to the departmental dose-optimization program which includes automated exposure control, adjustment of the mA and/or kV according to patient size and/or use of iterative reconstruction technique. COMPARISON:  PET-CT, 01/20/2023 CT chest, 12/09/2022 FINDINGS: Cardiovascular: Aortic atherosclerosis. Normal heart size. Three-vessel coronary artery calcifications. No pericardial effusion. Mediastinum/Nodes: No enlarged mediastinal, hilar, or axillary lymph nodes. Thyroid gland, trachea, and esophagus demonstrate no significant findings. Lungs/Pleura: Unchanged spiculated nodule of the anterior right upper lobe measuring 1.0 x 0.9 cm (series 4, image 74). Unchanged lobulated nodule of the anterior inferolateral segment right middle lobe measuring 1.4 x 1.2 cm (series 4, image 142). Moderate underlying centrilobular and paraseptal emphysema. Diffuse bilateral bronchial wall thickening. Cystic scarring of the dependent right lung base. No pleural effusion or pneumothorax. Upper Abdomen: No acute abnormality. Musculoskeletal: No chest wall abnormality. No acute osseous findings. IMPRESSION: 1. Unchanged spiculated nodule of the anterior right upper lobe measuring 1.0 x 0.9 cm. 2. Unchanged lobulated nodule of the anterior inferolateral segment right middle lobe measuring 1.4 x 1.2 cm. 3. Both of these nodules previously FDG avid and consistent with small adenocarcinomas. 4. No evidence of  lymphadenopathy or metastatic disease in the chest. 5. Emphysema and diffuse bilateral bronchial wall thickening. 6. Coronary artery disease. Aortic Atherosclerosis (ICD10-I70.0) and Emphysema (ICD10-J43.9). Electronically Signed   By: Jearld Lesch M.D.   On: 03/14/2023 12:55   DG Chest 2 View Result Date: 03/11/2023 CLINICAL DATA:  Preop for bronchoscopy. EXAM: CHEST - 2 VIEW COMPARISON:  Same date chest CT. FINDINGS: Right upper and middle lobe nodules on same date chest CT are not well seen by radiograph. Emphysema with hyperinflation and bronchial thickening. Normal heart size and mediastinal contours. No pleural effusion or pneumothorax. Mild degenerative change in the spine. IMPRESSION: Emphysema. Right upper and middle lobe nodules on same date chest CT are not well seen by radiograph. Electronically Signed   By: Narda Rutherford M.D.   On: 03/11/2023 21:29   DG C-ARM BRONCHOSCOPY Result Date: 03/04/2023 C-ARM BRONCHOSCOPY: Fluoroscopy was utilized by the requesting physician.  No radiographic interpretation.   MR BRAIN W WO CONTRAST Result Date: 02/28/2023 CLINICAL DATA:  Provided history: Malignant neoplasm of unspecified part of unspecified bronchus or lung. Non-small cell lung cancer, staging. EXAM: MRI HEAD WITHOUT AND WITH CONTRAST TECHNIQUE: Multiplanar, multiecho pulse sequences of the brain and surrounding structures were obtained without and with intravenous contrast. CONTRAST:  9mL GADAVIST GADOBUTROL 1 MMOL/ML IV SOLN COMPARISON:  Brain MRI 06/10/2022. FINDINGS: Brain: No age advanced or lobar predominant parenchymal atrophy. Multifocal T2 FLAIR hyperintense signal abnormality within the cerebral white matter, mild but greater than expected for age. Small focus of asymmetric enhancement within the right internal auditory canal (series 16, images images 46 and 47). No cortical encephalomalacia is identified. There is no acute infarct. No chronic intracranial blood products. No  extra-axial fluid collection. No midline shift. No evidence of intracranial metastatic disease. Vascular: Maintained flow voids within the proximal large arterial vessels. Skull and upper cervical spine: No focal worrisome marrow lesion. Sinuses/Orbits: No mass or acute finding within the imaged orbits. No significant paranasal sinus disease. Impression #2 will  be called to the ordering clinician or representative by the Radiologist Assistant, and communication documented in the PACS or [employer]. IMPRESSION: 1. No evidence of intracranial metastatic disease. 2. Small focus of asymmetric enhancement within the right internal auditory canal. While this could reflect vascular enhancement, an internal auditory canal protocol brain MRI (with and without contrast) is recommended to exclude a vestibular schwannoma. Of note, a history of recurrent vertigo was provided for the prior brain MRI of 06/10/2022. 3. Chronic small vessel ischemic changes within the cerebral white matter, mild but greater than expected for age. Findings are similar to the prior MRI. Electronically Signed   By: Jackey Loge D.O.   On: 02/28/2023 17:45      IMPRESSION/PLAN: 1. 62 y/o man with synchronous early stage lung cancers:  Stage IA1 NSCLC of the RUL and Stage IA2 NSCLC RML.    The patient brought his wife today, for whom he serves as sole caretaker.  His wife is Samson Frederic, who headed up the [facility] [employer] office for decades.  We spent a few minutes reminiscing about that hospital and the staff there.  Today, I also talked to the patient and family about the findings and workup thus far. We discussed the natural history of early stage NSCLC carcinoma and general treatment, highlighting the role of SBRT radiotherapy in the management. We discussed the available radiation techniques, and focused on the details and logistics of delivery. We reviewed the anticipated acute and late sequelae associated with  radiation in this setting. The patient was encouraged to ask questions that were answered to his/her satisfaction.  He would like to proceed with SBRT and will return for CT based treatment simulation next week.  I personally spent 60 minutes in this encounter including chart review, reviewing radiological studies, meeting face-to-face with the patient, entering orders and completing documentation.   ------------------------------------------------   Margaretmary Dys, MD [facility]  Radiation Oncology Direct Dial: [phone]  Fax: [phone] [email or web address]  Skype  LinkedIn

## 2023-04-04 ENCOUNTER — Ambulatory Visit: Payer: Medicare Other

## 2023-04-04 ENCOUNTER — Ambulatory Visit: Payer: Medicare Other | Admitting: Radiation Oncology

## 2023-04-06 ENCOUNTER — Other Ambulatory Visit: Payer: Self-pay

## 2023-04-06 ENCOUNTER — Ambulatory Visit
Admission: RE | Admit: 2023-04-06 | Discharge: 2023-04-06 | Disposition: A | Discharge status: Home / Self Care | Payer: 59 | Source: Ambulatory Visit | Attending: Radiation Oncology | Admitting: Radiation Oncology

## 2023-04-06 DIAGNOSIS — Z510 Encounter for antineoplastic radiation therapy: Secondary | ICD-10-CM | POA: Diagnosis not present

## 2023-04-06 DIAGNOSIS — C3411 Malignant neoplasm of upper lobe, right bronchus or lung: Secondary | ICD-10-CM | POA: Diagnosis not present

## 2023-04-06 DIAGNOSIS — C342 Malignant neoplasm of middle lobe, bronchus or lung: Secondary | ICD-10-CM | POA: Diagnosis not present

## 2023-04-06 NOTE — Progress Notes (Signed)
  Radiation Oncology         [phone] ________________________________  Name: Terrence Swanson MRN: [id number]  Date: 04/06/2023  DOB: 13-Sep-1960  STEREOTACTIC BODY RADIOTHERAPY SIMULATION AND TREATMENT PLANNING NOTE    ICD-10-CM   1. Malignant neoplasm of right upper lobe of lung (HCC)  C34.11       DIAGNOSIS:   63 y/o man with synchronous early stage lung cancers:  Stage IA1 NSCLC of the RUL and Stage IA2 NSCLC RML.   NARRATIVE:  The patient was brought to the CT Simulation planning suite.  Identity was confirmed.  All relevant records and images related to the planned course of therapy were reviewed.  The patient freely provided informed written consent to proceed with treatment after reviewing the details related to the planned course of therapy. The consent form was witnessed and verified by the simulation staff.  Then, the patient was set-up in a stable reproducible  supine position for radiation therapy.  A BodyFix immobilization pillow was fabricated for reproducible positioning.  Then I personally applied the abdominal compression paddle to limit respiratory excursion.  4D respiratoy motion management CT images were obtained.  Surface markings were placed.  The CT images were loaded into the planning software.  Then, using Cine, MIP, and standard views, the internal target volume (ITV) and planning target volumes (PTV) were delinieated, and avoidance structures were contoured.  Treatment planning then occurred.  The radiation prescription was entered and confirmed.  A total of two complex treatment devices were fabricated in the form of the BodyFix immobilization pillow and a neck accuform cushion.  I have requested : 3D Simulation  I have requested a DVH of the following structures: Heart, Lungs, Esophagus, Chest Wall, Brachial Plexus, Major Blood Vessels, and targets.  SPECIAL TREATMENT PROCEDURE:  The planned course of therapy using radiation constitutes a special treatment procedure.  Special care is required in the management of this patient for the following reasons. This treatment constitutes a Special Treatment Procedure for the following reason: [ High dose per fraction requiring special monitoring for increased toxicities of treatment including daily imaging..  The special nature of the planned course of radiotherapy will require increased physician supervision and oversight to ensure patient's safety with optimal treatment outcomes.  This requires extended time and effort.    RESPIRATORY MOTION MANAGEMENT SIMULATION:  In order to account for effect of respiratory motion on target structures and other organs in the planning and delivery of radiotherapy, this patient underwent respiratory motion management simulation.  To accomplish this, when the patient was brought to the CT simulation planning suite, 4D respiratoy motion management CT images were obtained.  The CT images were loaded into the planning software.  Then, using a variety of tools including Cine, MIP, and standard views, the target volume and planning target volumes (PTV) were delineated.  Avoidance structures were contoured.  Treatment planning then occurred.  Dose volume histograms were generated and reviewed for each of the requested structure.  The resulting plan was carefully reviewed and approved today.  PLAN:  The patient will receive 54 Gy in 3 fractions to the RUL and RML lung nodules.  ________________________________  [name], M.D.

## 2023-04-18 DIAGNOSIS — C342 Malignant neoplasm of middle lobe, bronchus or lung: Secondary | ICD-10-CM | POA: Diagnosis not present

## 2023-04-18 DIAGNOSIS — Z510 Encounter for antineoplastic radiation therapy: Secondary | ICD-10-CM | POA: Diagnosis not present

## 2023-04-18 DIAGNOSIS — K219 Gastro-esophageal reflux disease without esophagitis: Secondary | ICD-10-CM | POA: Diagnosis not present

## 2023-04-18 DIAGNOSIS — C3411 Malignant neoplasm of upper lobe, right bronchus or lung: Secondary | ICD-10-CM | POA: Diagnosis not present

## 2023-04-18 DIAGNOSIS — E7849 Other hyperlipidemia: Secondary | ICD-10-CM | POA: Diagnosis not present

## 2023-04-19 ENCOUNTER — Ambulatory Visit: Payer: 59

## 2023-04-20 ENCOUNTER — Ambulatory Visit: Payer: 59

## 2023-04-21 ENCOUNTER — Other Ambulatory Visit: Payer: Self-pay

## 2023-04-21 ENCOUNTER — Ambulatory Visit
Admission: RE | Admit: 2023-04-21 | Discharge: 2023-04-21 | Disposition: A | Discharge status: Home / Self Care | Payer: 59 | Source: Ambulatory Visit | Attending: Radiation Oncology | Admitting: Radiation Oncology

## 2023-04-21 DIAGNOSIS — C3411 Malignant neoplasm of upper lobe, right bronchus or lung: Secondary | ICD-10-CM | POA: Diagnosis not present

## 2023-04-21 DIAGNOSIS — Z510 Encounter for antineoplastic radiation therapy: Secondary | ICD-10-CM | POA: Diagnosis not present

## 2023-04-21 LAB — RAD ONC ARIA SESSION SUMMARY
Course Elapsed Days: 0
Plan Fractions Treated to Date: 1
Plan Fractions Treated to Date: 1
Plan Prescribed Dose Per Fraction: 18 Gy
Plan Prescribed Dose Per Fraction: 18 Gy
Plan Total Fractions Prescribed: 3
Plan Total Fractions Prescribed: 3
Plan Total Prescribed Dose: 54 Gy
Plan Total Prescribed Dose: 54 Gy
Reference Point Dosage Given to Date: 18 Gy
Reference Point Dosage Given to Date: 18 Gy
Reference Point Session Dosage Given: 18 Gy
Reference Point Session Dosage Given: 18 Gy
Session Number: 1

## 2023-04-22 ENCOUNTER — Ambulatory Visit: Payer: 59

## 2023-04-25 ENCOUNTER — Ambulatory Visit
Admission: RE | Admit: 2023-04-25 | Discharge: 2023-04-25 | Disposition: A | Discharge status: Home / Self Care | Payer: 59 | Source: Ambulatory Visit | Attending: Radiation Oncology

## 2023-04-25 ENCOUNTER — Other Ambulatory Visit: Payer: Self-pay

## 2023-04-25 DIAGNOSIS — Z510 Encounter for antineoplastic radiation therapy: Secondary | ICD-10-CM | POA: Diagnosis not present

## 2023-04-25 DIAGNOSIS — C3411 Malignant neoplasm of upper lobe, right bronchus or lung: Secondary | ICD-10-CM | POA: Diagnosis not present

## 2023-04-25 LAB — RAD ONC ARIA SESSION SUMMARY
Course Elapsed Days: 4
Plan Fractions Treated to Date: 2
Plan Fractions Treated to Date: 2
Plan Prescribed Dose Per Fraction: 18 Gy
Plan Prescribed Dose Per Fraction: 18 Gy
Plan Total Fractions Prescribed: 3
Plan Total Fractions Prescribed: 3
Plan Total Prescribed Dose: 54 Gy
Plan Total Prescribed Dose: 54 Gy
Reference Point Dosage Given to Date: 36 Gy
Reference Point Dosage Given to Date: 36 Gy
Reference Point Session Dosage Given: 18 Gy
Reference Point Session Dosage Given: 18 Gy
Session Number: 2

## 2023-04-27 ENCOUNTER — Ambulatory Visit
Admission: RE | Admit: 2023-04-27 | Discharge: 2023-04-27 | Disposition: A | Discharge status: Home / Self Care | Payer: 59 | Source: Ambulatory Visit | Attending: Radiation Oncology | Admitting: Radiation Oncology

## 2023-04-27 ENCOUNTER — Other Ambulatory Visit: Payer: Self-pay

## 2023-04-27 ENCOUNTER — Ambulatory Visit: Payer: 59

## 2023-04-27 DIAGNOSIS — C342 Malignant neoplasm of middle lobe, bronchus or lung: Secondary | ICD-10-CM

## 2023-04-27 DIAGNOSIS — C3411 Malignant neoplasm of upper lobe, right bronchus or lung: Secondary | ICD-10-CM

## 2023-04-27 DIAGNOSIS — Z510 Encounter for antineoplastic radiation therapy: Secondary | ICD-10-CM | POA: Diagnosis not present

## 2023-04-27 LAB — RAD ONC ARIA SESSION SUMMARY
Course Elapsed Days: 6
Plan Fractions Treated to Date: 3
Plan Fractions Treated to Date: 3
Plan Prescribed Dose Per Fraction: 18 Gy
Plan Prescribed Dose Per Fraction: 18 Gy
Plan Total Fractions Prescribed: 3
Plan Total Fractions Prescribed: 3
Plan Total Prescribed Dose: 54 Gy
Plan Total Prescribed Dose: 54 Gy
Reference Point Dosage Given to Date: 54 Gy
Reference Point Dosage Given to Date: 54 Gy
Reference Point Session Dosage Given: 18 Gy
Reference Point Session Dosage Given: 18 Gy
Session Number: 3

## 2023-04-28 NOTE — Radiation Completion Notes (Addendum)
  Radiation Oncology         [phone] ________________________________  Name: Terrence Swanson: [id number]  Date: 04/27/2023  DOB: [birth year]  Referring Physician: Doreatha Massed, M.D. Date of Service: 2023-04-28 Radiation Oncologist: Margaretmary Bayley, M.D. [facility]     RADIATION ONCOLOGY END OF TREATMENT NOTE     Diagnosis: 63 y/o man with synchronous early stage lung cancers:  Stage IA1 NSCLC of the RUL and Stage IA2 NSCLC RML.   Intent: Curative     ==========DELIVERED PLANS==========  First Treatment Date: 2023-04-21 Last Treatment Date: 2023-04-27   Plan Name: Lung_RU_SBRT Site: Lung, Right Technique: SBRT/SRT-IMRT Mode: Photon Dose Per Fraction: 18 Gy Prescribed Dose (Delivered / Prescribed): 54 Gy / 54 Gy Prescribed Fxs (Delivered / Prescribed): 3 / 3   Plan Name: Lung_RM_SBRT Site: Lung, Right Technique: SBRT/SRT-IMRT Mode: Photon Dose Per Fraction: 18 Gy Prescribed Dose (Delivered / Prescribed): 54 Gy / 54 Gy Prescribed Fxs (Delivered / Prescribed): 3 / 3     ==========ON TREATMENT VISIT DATES========== 2023-04-21, 2023-04-21, 2023-04-25, 2023-04-25, 2023-04-27, 2023-04-27, 2023-04-27   See weekly On Treatment Notes in Epic for details in the Media tab (listed as Progress notes on the On Treatment Visit Dates listed above).  The patient will receive a call in about one month from the radiation oncology department. He will continue follow up with his medical oncologist, Dr. Ellin Saba for continued management of his systemic disease and we will plan to see him back on an as-needed basis.  ------------------------------------------------   Margaretmary Dys, MD [facility]  Radiation Oncology Direct Dial: [phone]  Fax: [phone] [email or web address]  Skype  LinkedIn

## 2023-04-29 ENCOUNTER — Ambulatory Visit: Payer: 59

## 2023-05-24 ENCOUNTER — Ambulatory Visit
Admission: RE | Admit: 2023-05-24 | Discharge: 2023-05-24 | Disposition: A | Discharge status: Home / Self Care | Payer: 59 | Source: Ambulatory Visit | Attending: Radiation Oncology | Admitting: Radiation Oncology

## 2023-05-24 NOTE — Progress Notes (Signed)
  Radiation Oncology         ([phone] ________________________________  Name: Terrence Swanson DOBIE MRN: [id number]  Date of Service: 05/24/2023  DOB: 10-10-1960  Post Treatment Telephone Note  Diagnosis:  63 y/o man with synchronous early stage lung cancers: Stage IA1 NSCLC of the RUL and Stage IA2 NSCLC RML. (as documented in provider EOT note)  The patient was available for call today.   Symptoms of fatigue have improved since completing therapy.  Symptoms of skin changes have improved since completing therapy.  Symptoms of esophagitis have improved since completing therapy.  The patient has scheduled follow up with his medical oncologist Dr. Ellin Saba for ongoing care, and was encouraged to call if he  develops concerns or questions regarding radiation.   Patient has decided to switch his f/u care to Dr. Langston Masker.  This concludes the interaction.  Ruel Favors, LPN

## 2023-05-25 ENCOUNTER — Telehealth: Payer: Self-pay | Admitting: *Deleted

## 2023-05-25 DIAGNOSIS — Z0000 Encounter for general adult medical examination without abnormal findings: Secondary | ICD-10-CM | POA: Diagnosis not present

## 2023-05-25 DIAGNOSIS — E782 Mixed hyperlipidemia: Secondary | ICD-10-CM | POA: Diagnosis not present

## 2023-05-25 DIAGNOSIS — K219 Gastro-esophageal reflux disease without esophagitis: Secondary | ICD-10-CM | POA: Diagnosis not present

## 2023-05-25 NOTE — Telephone Encounter (Signed)
 Patient called to request all of his oncology care to [facility].  Referral sent and Dr. Anders Simmonds made aware.

## 2023-06-07 ENCOUNTER — Other Ambulatory Visit: Payer: Self-pay

## 2023-06-07 DIAGNOSIS — C3490 Malignant neoplasm of unspecified part of unspecified bronchus or lung: Secondary | ICD-10-CM

## 2023-06-08 ENCOUNTER — Ambulatory Visit (HOSPITAL_COMMUNITY): Admission: RE | Admit: 2023-06-08 | Payer: 59 | Source: Ambulatory Visit

## 2023-06-08 ENCOUNTER — Telehealth: Payer: Self-pay | Admitting: *Deleted

## 2023-06-08 ENCOUNTER — Inpatient Hospital Stay: Payer: Medicaid Other

## 2023-06-08 DIAGNOSIS — Z635 Disruption of family by separation and divorce: Secondary | ICD-10-CM | POA: Diagnosis not present

## 2023-06-08 DIAGNOSIS — K219 Gastro-esophageal reflux disease without esophagitis: Secondary | ICD-10-CM | POA: Diagnosis not present

## 2023-06-08 DIAGNOSIS — M47814 Spondylosis without myelopathy or radiculopathy, thoracic region: Secondary | ICD-10-CM | POA: Diagnosis not present

## 2023-06-08 DIAGNOSIS — Z7982 Long term (current) use of aspirin: Secondary | ICD-10-CM | POA: Diagnosis not present

## 2023-06-08 DIAGNOSIS — M5124 Other intervertebral disc displacement, thoracic region: Secondary | ICD-10-CM | POA: Diagnosis not present

## 2023-06-08 DIAGNOSIS — Z85118 Personal history of other malignant neoplasm of bronchus and lung: Secondary | ICD-10-CM | POA: Diagnosis not present

## 2023-06-08 DIAGNOSIS — Z955 Presence of coronary angioplasty implant and graft: Secondary | ICD-10-CM | POA: Diagnosis not present

## 2023-06-08 DIAGNOSIS — W010XXA Fall on same level from slipping, tripping and stumbling without subsequent striking against object, initial encounter: Secondary | ICD-10-CM | POA: Diagnosis not present

## 2023-06-08 DIAGNOSIS — M51369 Other intervertebral disc degeneration, lumbar region without mention of lumbar back pain or lower extremity pain: Secondary | ICD-10-CM | POA: Diagnosis not present

## 2023-06-08 DIAGNOSIS — S32019A Unspecified fracture of first lumbar vertebra, initial encounter for closed fracture: Secondary | ICD-10-CM | POA: Diagnosis not present

## 2023-06-08 DIAGNOSIS — S32008A Other fracture of unspecified lumbar vertebra, initial encounter for closed fracture: Secondary | ICD-10-CM | POA: Diagnosis not present

## 2023-06-08 DIAGNOSIS — Z79899 Other long term (current) drug therapy: Secondary | ICD-10-CM | POA: Diagnosis not present

## 2023-06-08 DIAGNOSIS — C3490 Malignant neoplasm of unspecified part of unspecified bronchus or lung: Secondary | ICD-10-CM | POA: Diagnosis not present

## 2023-06-08 DIAGNOSIS — J449 Chronic obstructive pulmonary disease, unspecified: Secondary | ICD-10-CM | POA: Diagnosis not present

## 2023-06-08 DIAGNOSIS — I739 Peripheral vascular disease, unspecified: Secondary | ICD-10-CM | POA: Diagnosis not present

## 2023-06-08 DIAGNOSIS — Z7902 Long term (current) use of antithrombotics/antiplatelets: Secondary | ICD-10-CM | POA: Diagnosis not present

## 2023-06-08 DIAGNOSIS — Z5941 Food insecurity: Secondary | ICD-10-CM | POA: Diagnosis not present

## 2023-06-08 DIAGNOSIS — W19XXXA Unspecified fall, initial encounter: Secondary | ICD-10-CM | POA: Diagnosis not present

## 2023-06-08 DIAGNOSIS — S32010A Wedge compression fracture of first lumbar vertebra, initial encounter for closed fracture: Secondary | ICD-10-CM | POA: Diagnosis not present

## 2023-06-08 DIAGNOSIS — E785 Hyperlipidemia, unspecified: Secondary | ICD-10-CM | POA: Diagnosis not present

## 2023-06-08 DIAGNOSIS — Z87891 Personal history of nicotine dependence: Secondary | ICD-10-CM | POA: Diagnosis not present

## 2023-06-08 DIAGNOSIS — Z9889 Other specified postprocedural states: Secondary | ICD-10-CM | POA: Diagnosis not present

## 2023-06-08 DIAGNOSIS — Z602 Problems related to living alone: Secondary | ICD-10-CM | POA: Diagnosis not present

## 2023-06-08 DIAGNOSIS — M48061 Spinal stenosis, lumbar region without neurogenic claudication: Secondary | ICD-10-CM | POA: Diagnosis not present

## 2023-06-08 DIAGNOSIS — M545 Low back pain, unspecified: Secondary | ICD-10-CM | POA: Diagnosis not present

## 2023-06-08 NOTE — Telephone Encounter (Signed)
 Patient called to make [place] aware that he has fallen and is unable to get out of the bed to make it to lab appointment or CT.  Patient was advised to call 911 for evaluation.  Verbalized understanding.

## 2023-06-09 DIAGNOSIS — S32010A Wedge compression fracture of first lumbar vertebra, initial encounter for closed fracture: Secondary | ICD-10-CM | POA: Insufficient documentation

## 2023-06-13 DIAGNOSIS — C3491 Malignant neoplasm of unspecified part of right bronchus or lung: Secondary | ICD-10-CM | POA: Diagnosis not present

## 2023-06-13 DIAGNOSIS — R17 Unspecified jaundice: Secondary | ICD-10-CM | POA: Diagnosis not present

## 2023-06-13 DIAGNOSIS — R778 Other specified abnormalities of plasma proteins: Secondary | ICD-10-CM | POA: Diagnosis not present

## 2023-06-13 DIAGNOSIS — C3490 Malignant neoplasm of unspecified part of unspecified bronchus or lung: Secondary | ICD-10-CM | POA: Diagnosis not present

## 2023-06-13 DIAGNOSIS — Z8781 Personal history of (healed) traumatic fracture: Secondary | ICD-10-CM | POA: Diagnosis not present

## 2023-06-16 ENCOUNTER — Inpatient Hospital Stay: Payer: Medicaid Other | Admitting: Oncology

## 2023-06-20 DIAGNOSIS — R590 Localized enlarged lymph nodes: Secondary | ICD-10-CM | POA: Diagnosis not present

## 2023-06-20 DIAGNOSIS — C3411 Malignant neoplasm of upper lobe, right bronchus or lung: Secondary | ICD-10-CM | POA: Diagnosis not present

## 2023-06-20 DIAGNOSIS — C3490 Malignant neoplasm of unspecified part of unspecified bronchus or lung: Secondary | ICD-10-CM | POA: Diagnosis not present

## 2023-06-28 DIAGNOSIS — Z8781 Personal history of (healed) traumatic fracture: Secondary | ICD-10-CM | POA: Diagnosis not present

## 2023-06-28 DIAGNOSIS — Z923 Personal history of irradiation: Secondary | ICD-10-CM | POA: Diagnosis not present

## 2023-06-28 DIAGNOSIS — Z9181 History of falling: Secondary | ICD-10-CM | POA: Diagnosis not present

## 2023-06-28 DIAGNOSIS — J984 Other disorders of lung: Secondary | ICD-10-CM | POA: Diagnosis not present

## 2023-06-28 DIAGNOSIS — M48061 Spinal stenosis, lumbar region without neurogenic claudication: Secondary | ICD-10-CM | POA: Diagnosis not present

## 2023-06-28 DIAGNOSIS — C3411 Malignant neoplasm of upper lobe, right bronchus or lung: Secondary | ICD-10-CM | POA: Diagnosis not present

## 2023-06-28 DIAGNOSIS — R748 Abnormal levels of other serum enzymes: Secondary | ICD-10-CM | POA: Diagnosis not present

## 2023-06-28 DIAGNOSIS — S32010A Wedge compression fracture of first lumbar vertebra, initial encounter for closed fracture: Secondary | ICD-10-CM | POA: Diagnosis not present

## 2023-06-28 DIAGNOSIS — C3481 Malignant neoplasm of overlapping sites of right bronchus and lung: Secondary | ICD-10-CM | POA: Diagnosis not present

## 2023-06-28 DIAGNOSIS — M47814 Spondylosis without myelopathy or radiculopathy, thoracic region: Secondary | ICD-10-CM | POA: Diagnosis not present

## 2023-06-28 DIAGNOSIS — C3490 Malignant neoplasm of unspecified part of unspecified bronchus or lung: Secondary | ICD-10-CM | POA: Diagnosis not present

## 2023-06-28 DIAGNOSIS — R590 Localized enlarged lymph nodes: Secondary | ICD-10-CM | POA: Diagnosis not present

## 2023-06-29 DIAGNOSIS — Z9181 History of falling: Secondary | ICD-10-CM | POA: Insufficient documentation

## 2023-07-06 DIAGNOSIS — E782 Mixed hyperlipidemia: Secondary | ICD-10-CM | POA: Diagnosis not present

## 2023-07-09 DIAGNOSIS — S32019A Unspecified fracture of first lumbar vertebra, initial encounter for closed fracture: Secondary | ICD-10-CM | POA: Diagnosis not present

## 2023-07-09 DIAGNOSIS — S22089A Unspecified fracture of T11-T12 vertebra, initial encounter for closed fracture: Secondary | ICD-10-CM | POA: Diagnosis not present

## 2023-07-09 DIAGNOSIS — R1032 Left lower quadrant pain: Secondary | ICD-10-CM | POA: Diagnosis not present

## 2023-07-18 DIAGNOSIS — C3481 Malignant neoplasm of overlapping sites of right bronchus and lung: Secondary | ICD-10-CM | POA: Diagnosis not present

## 2023-07-18 DIAGNOSIS — C771 Secondary and unspecified malignant neoplasm of intrathoracic lymph nodes: Secondary | ICD-10-CM | POA: Diagnosis not present

## 2023-07-18 DIAGNOSIS — R590 Localized enlarged lymph nodes: Secondary | ICD-10-CM | POA: Diagnosis not present

## 2023-07-20 DIAGNOSIS — S32019D Unspecified fracture of first lumbar vertebra, subsequent encounter for fracture with routine healing: Secondary | ICD-10-CM | POA: Diagnosis not present

## 2023-07-20 DIAGNOSIS — C3411 Malignant neoplasm of upper lobe, right bronchus or lung: Secondary | ICD-10-CM | POA: Diagnosis not present

## 2023-07-20 DIAGNOSIS — R590 Localized enlarged lymph nodes: Secondary | ICD-10-CM | POA: Diagnosis not present

## 2023-07-20 DIAGNOSIS — Z87891 Personal history of nicotine dependence: Secondary | ICD-10-CM | POA: Diagnosis not present

## 2023-07-20 DIAGNOSIS — C3491 Malignant neoplasm of unspecified part of right bronchus or lung: Secondary | ICD-10-CM | POA: Diagnosis not present

## 2023-07-20 DIAGNOSIS — C3490 Malignant neoplasm of unspecified part of unspecified bronchus or lung: Secondary | ICD-10-CM | POA: Diagnosis not present

## 2023-07-20 DIAGNOSIS — C3481 Malignant neoplasm of overlapping sites of right bronchus and lung: Secondary | ICD-10-CM | POA: Diagnosis not present

## 2023-07-25 DIAGNOSIS — C3481 Malignant neoplasm of overlapping sites of right bronchus and lung: Secondary | ICD-10-CM | POA: Diagnosis not present

## 2023-07-25 DIAGNOSIS — R590 Localized enlarged lymph nodes: Secondary | ICD-10-CM | POA: Diagnosis not present

## 2023-07-25 DIAGNOSIS — R930 Abnormal findings on diagnostic imaging of skull and head, not elsewhere classified: Secondary | ICD-10-CM | POA: Diagnosis not present

## 2023-08-03 DIAGNOSIS — C3481 Malignant neoplasm of overlapping sites of right bronchus and lung: Secondary | ICD-10-CM | POA: Diagnosis not present

## 2023-08-03 DIAGNOSIS — C3490 Malignant neoplasm of unspecified part of unspecified bronchus or lung: Secondary | ICD-10-CM | POA: Diagnosis not present

## 2023-08-03 DIAGNOSIS — C3411 Malignant neoplasm of upper lobe, right bronchus or lung: Secondary | ICD-10-CM | POA: Diagnosis not present

## 2023-08-08 ENCOUNTER — Ambulatory Visit
Attending: Thoracic Surgery (Cardiothoracic Vascular Surgery) | Admitting: Thoracic Surgery (Cardiothoracic Vascular Surgery)

## 2023-08-08 ENCOUNTER — Other Ambulatory Visit: Payer: Self-pay | Admitting: *Deleted

## 2023-08-08 ENCOUNTER — Encounter: Payer: Self-pay | Admitting: *Deleted

## 2023-08-08 VITALS — BP 135/70 | HR 66 | Resp 20 | Ht 72.0 in | Wt 193.0 lb

## 2023-08-08 DIAGNOSIS — F32A Depression, unspecified: Secondary | ICD-10-CM | POA: Insufficient documentation

## 2023-08-08 DIAGNOSIS — C3411 Malignant neoplasm of upper lobe, right bronchus or lung: Secondary | ICD-10-CM

## 2023-08-08 DIAGNOSIS — F419 Anxiety disorder, unspecified: Secondary | ICD-10-CM | POA: Insufficient documentation

## 2023-08-08 DIAGNOSIS — R590 Localized enlarged lymph nodes: Secondary | ICD-10-CM

## 2023-08-08 DIAGNOSIS — K219 Gastro-esophageal reflux disease without esophagitis: Secondary | ICD-10-CM | POA: Insufficient documentation

## 2023-08-08 NOTE — Progress Notes (Signed)
 [address]       Terrence Swanson [address]             [phone]      HPI: Terrence Swanson is sent for consultation for biopsy of mediastinal lymph nodes.  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  Found to have suspicious nodules in the right upper and middle lobes on CT for lung cancer screening.  On PET/CT both were hypermetabolic.  There was no adenopathy.  Navigational bronchoscopy showed adenocarcinoma in the upper lobe nodule.  He was offered surgical resection but declined and opted for stereotactic radiation.  Recently had a restaging PET/CT which showed new right hilar and mediastinal adenopathy with involvement of level 10 and 7 lymph nodes.  No chest pain, pressure, tightness, or shortness of breath.  Chronic back pain.  Past Medical History:  Diagnosis Date   Anxiety    Arthritis    Asthma    Complication of anesthesia    difficult to wake up after anesthesia   COPD (chronic obstructive pulmonary disease) (HCC)    Depression    Diverticulosis    GERD (gastroesophageal reflux disease)    Herniated lumbar intervertebral disc    Labral tear of shoulder    left shoulder   Peripheral vascular disease (HCC)    Past Surgical History:  Procedure Laterality Date   ABDOMINAL AORTAGRAM N/A 07/24/2014   Procedure: ABDOMINAL Tommi Fraise;  Surgeon: Wenona Hamilton, MD;  Location: [facility];  Service: Cardiovascular;  Laterality: N/A;   ABDOMINAL AORTOGRAM N/A 07/28/2016   Procedure: Abdominal Aortogram;  Surgeon: Wenona Hamilton, MD;  Location: [facility];  Service: Cardiovascular;  Laterality: N/A;   INGUINAL HERNIA REPAIR Left 03/18/2021   Procedure: LEFT INGUINAL HERNIA REAPAIR W/ MESH (ADULT);  Surgeon: Alanda Allegra, MD;  Location: [facility];  Service: General;  Laterality: Left;   LEFT  HEART CATH AND CORONARY ANGIOGRAPHY N/A 07/27/2019   Procedure: LEFT HEART CATH AND CORONARY ANGIOGRAPHY;  Surgeon: Wenona Hamilton, MD;  [facility]: [facility];  Service: Cardiovascular;  Laterality: N/A;   PERCUTANEOUS STENT INTERVENTION N/A 07/24/2014   Procedure: PERCUTANEOUS STENT INTERVENTION;  Surgeon: Wenona Hamilton, MD;  [facility]: [facility];  Service: Cardiovascular;  Laterality: N/A;  rt and left common iliacs   PERIPHERAL VASCULAR INTERVENTION Bilateral 07/28/2016   Procedure: Peripheral Vascular Intervention;  Surgeon: Wenona Hamilton, MD;  [facility];  Service: Cardiovascular;  Laterality: Bilateral;  COMMON ILIACS   VIDEO BRONCHOSCOPY WITH ENDOBRONCHIAL NAVIGATION N/A 03/04/2023   Procedure: VIDEO BRONCHOSCOPY WITH ENDOBRONCHIAL NAVIGATION;  Surgeon: Zelphia Higashi, MD;  Location: [facility];  Service: Thoracic;  Laterality: N/A;    Current Outpatient Medications  Medication Sig Dispense Refill   aspirin  EC 81 MG tablet Take 1 tablet (81 mg total) by mouth daily. (Patient taking differently: Take 81 mg by mouth at bedtime.)     atorvastatin  (LIPITOR) 40 MG tablet Take 1 tablet (40 mg total) by mouth daily. 30 tablet 10   clonazePAM (KLONOPIN) 1 MG tablet Take 1 mg by mouth 2 (two) times daily as needed for anxiety.     hydrocortisone cream 1 % Apply 1 application topically daily as needed for itching.     pantoprazole (PROTONIX) 40 MG tablet Take 40 mg by mouth  daily.     acetaminophen  (TYLENOL ) 500 MG tablet Take 500-1,000 mg by mouth every 6 (six) hours as needed for moderate pain (pain score 4-6).     No current facility-administered medications for this visit.    Physical Exam BP 135/70   Pulse Terrence   Resp 20   Ht 6' (1.829 m)   Wt 193 lb (87.5 kg)   SpO2 93% Comment: RA  BMI 26.20 kg/m  63 year old Swanson in no acute distress Alert and oriented x 3 with no focal deficits Lungs clear with equal breath sounds bilaterally No cervical or  supraclavicular adenopathy Cardiac regular rate and rhythm  Diagnostic Tests: I reviewed the PET/CT images done in the [facility] system.  They are visible through our PACS system.  Hypermetabolic level 10 and 7 lymph nodes.  Level 7 node particularly enlarged.  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    Diagnosed with clinical stage Ia synchronous adenocarcinomas in the upper and middle lobes last winter.  Declined surgery and underwent stereotactic radiation.  Now on restaging has hilar and mediastinal adenopathy.  Markedly hypermetabolic.  Although infectious and inflammatory etiologies are in the differential, this almost certainly represents metastatic adenocarcinoma.  I recommended that we proceed with a endobronchial ultrasound for diagnostic purposes.  He understands this is not therapeutic.  I informed him of the general nature of the procedure including the need for general anesthesia.  He understands this is an endoscopic procedure done on an outpatient basis.  I informed him of the indications, risks, benefits, and alternatives.  He understands the risks include, but are not limited to death, MI, DVT, PE, bleeding, pneumothorax, failure to make a diagnosis, as well as the possibility of other procedural complications.  He understands it is very similar to the navigational bronchoscopy he had previously.  He accepts the risks and agrees to proceed.  Plan: Will call to schedule endobronchial ultrasound  Zelphia Higashi, MD [facility] [phone]

## 2023-08-08 NOTE — H&P (View-Only) (Signed)
 [address]       [place] Sw[place] [address]             [phone]      HPI: Terrence Swanson is sent for consultation for biopsy of mediastinal lymph nodes.  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  Found to have suspicious nodules in the right upper and middle lobes on CT for lung cancer screening.  On PET/CT both were hypermetabolic.  There was no adenopathy.  Navigational bronchoscopy showed adenocarcinoma in the upper lobe nodule.  He was offered surgical resection but declined and opted for stereotactic radiation.  Recently had a restaging PET/CT which showed new right hilar and mediastinal adenopathy with involvement of level 10 and 7 lymph nodes.  No chest pain, pressure, tightness, or shortness of breath.  Chronic back pain.  Past Medical History:  Diagnosis Date   Anxiety    Arthritis    Asthma    Complication of anesthesia    difficult to wake up after anesthesia   COPD (chronic obstructive pulmonary disease) (HCC)    Depression    Diverticulosis    GERD (gastroesophageal reflux disease)    Herniated lumbar intervertebral disc    Labral tear of shoulder    left shoulder   Peripheral vascular disease (HCC)    Past Surgical History:  Procedure Laterality Date   ABDOMINAL AORTAGRAM N/A 07/24/2014   Procedure: ABDOMINAL Tommi Fraise;  Surgeon: Wenona Hamilton, MD;  [facility];  Service: Cardiovascular;  Laterality: N/A;   ABDOMINAL AORTOGRAM N/A 07/28/2016   Procedure: Abdominal Aortogram;  Surgeon: Wenona Hamilton, MD;  [facility];  Service: Cardiovascular;  Laterality: N/A;   INGUINAL HERNIA REPAIR Left 03/18/2021   Procedure: LEFT INGUINAL HERNIA REAPAIR W/ MESH (ADULT);  Surgeon: Alanda Allegra, MD;  Location: [facility];  Service: General;  Laterality: Left;   LEFT  HEART CATH AND CORONARY ANGIOGRAPHY N/A 07/27/2019   Procedure: LEFT HEART CATH AND CORONARY ANGIOGRAPHY;  Surgeon: Wenona Hamilton, MD;  [facility];  Service: Cardiovascular;  Laterality: N/A;   PERCUTANEOUS STENT INTERVENTION N/A 07/24/2014   Procedure: PERCUTANEOUS STENT INTERVENTION;  Surgeon: Wenona Hamilton, MD;  [facility]: [facility];  Service: Cardiovascular;  Laterality: N/A;  rt and left common iliacs   PERIPHERAL VASCULAR INTERVENTION Bilateral 07/28/2016   Procedure: Peripheral Vascular Intervention;  Surgeon: Wenona Hamilton, MD;  [facility];  Service: Cardiovascular;  Laterality: Bilateral;  COMMON ILIACS   VIDEO BRONCHOSCOPY WITH ENDOBRONCHIAL NAVIGATION N/A 03/04/2023   Procedure: VIDEO BRONCHOSCOPY WITH ENDOBRONCHIAL NAVIGATION;  Surgeon: Zelphia Higashi, MD;  Location: [facility];  Service: Thoracic;  Laterality: N/A;    Current Outpatient Medications  Medication Sig Dispense Refill   aspirin  EC 81 MG tablet Take 1 tablet (81 mg total) by mouth daily. (Patient taking differently: Take 81 mg by mouth at bedtime.)     atorvastatin  (LIPITOR) 40 MG tablet Take 1 tablet (40 mg total) by mouth daily. 30 tablet 10   clonazePAM (KLONOPIN) 1 MG tablet Take 1 mg by mouth 2 (two) times daily as needed for anxiety.     hydrocortisone cream 1 % Apply 1 application topically daily as needed for itching.     pantoprazole (PROTONIX) 40 MG tablet Take 40 mg by mouth  daily.     acetaminophen  (TYLENOL ) 500 MG tablet Take 500-1,000 mg by mouth every 6 (six) hours as needed for moderate pain (pain score 4-6).     No current facility-administered medications for this visit.    Physical Exam BP 135/70   Pulse 66   Resp 20   Ht 6' (1.829 m)   Wt 193 lb (87.5 kg)   SpO2 93% Comment: RA  BMI 26.20 kg/m  63 year old man in no acute distress Alert and oriented x 3 with no focal deficits Lungs clear with equal breath sounds bilaterally No cervical or  supraclavicular adenopathy Cardiac regular rate and rhythm  Diagnostic Tests: I reviewed the PET/CT images done in the [facility] system.  They are visible through our PACS system.  Hypermetabolic level 10 and 7 lymph nodes.  Level 7 node particularly enlarged.  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    Diagnosed with clinical stage Ia synchronous adenocarcinomas in the upper and middle lobes last winter.  Declined surgery and underwent stereotactic radiation.  Now on restaging has hilar and mediastinal adenopathy.  Markedly hypermetabolic.  Although infectious and inflammatory etiologies are in the differential, this almost certainly represents metastatic adenocarcinoma.  I recommended that we proceed with a endobronchial ultrasound for diagnostic purposes.  He understands this is not therapeutic.  I informed him of the general nature of the procedure including the need for general anesthesia.  He understands this is an endoscopic procedure done on an outpatient basis.  I informed him of the indications, risks, benefits, and alternatives.  He understands the risks include, but are not limited to death, MI, DVT, PE, bleeding, pneumothorax, failure to make a diagnosis, as well as the possibility of other procedural complications.  He understands it is very similar to the navigational bronchoscopy he had previously.  He accepts the risks and agrees to proceed.  Plan: Will call to schedule endobronchial ultrasound  Zelphia Higashi, MD [facility] [phone]

## 2023-08-12 DIAGNOSIS — R590 Localized enlarged lymph nodes: Secondary | ICD-10-CM | POA: Diagnosis not present

## 2023-08-12 DIAGNOSIS — C3481 Malignant neoplasm of overlapping sites of right bronchus and lung: Secondary | ICD-10-CM | POA: Diagnosis not present

## 2023-08-18 NOTE — Pre-Procedure Instructions (Signed)
 Surgical Instructions   Your procedure is scheduled on Aug 24, 2023. Report to [facility] Main Entrance "A" at 9:30 A.M., then check in with the Admitting office. Any questions or running late day of surgery: call [phone]  Questions prior to your surgery date: call [phone], Monday-Friday, 8am-4pm. If you experience any cold or flu symptoms such as cough, fever, chills, shortness of breath, etc. between now and your scheduled surgery, please notify us  at the above number.     Remember:  Do not eat or drink after midnight the night before your surgery    Take these medicines the morning of surgery with A SIP OF WATER: pantoprazole (PROTONIX)    May take these medicines IF NEEDED: acetaminophen  (TYLENOL )  clonazePAM (KLONOPIN)    Continue taking your Aspirin  through the day before surgery. DO NOT take any the morning of surgery.   One week prior to surgery, STOP taking any Aleve, Naproxen, Ibuprofen, Motrin, Advil, Goody's, BC's, all herbal medications, fish oil, and non-prescription vitamins.                     Do NOT Smoke (Tobacco/Vaping) for 24 hours prior to your procedure.  If you use a CPAP at night, you may bring your mask/headgear for your overnight stay.   You will be asked to remove any contacts, glasses, piercing's, hearing aid's, dentures/partials prior to surgery. Please bring cases for these items if needed.    Patients discharged the day of surgery will not be allowed to drive home, and someone needs to stay with them for 24 hours.  SURGICAL WAITING ROOM VISITATION Patients may have no more than 2 support people in the waiting area - these visitors may rotate.   Pre-op  nurse will coordinate an appropriate time for 1 ADULT support person, who may not rotate, to accompany patient in pre-op .  Children under the age of 28 must have an adult with them who is not the patient and must remain in the main waiting area with an adult.  If the patient needs to  stay at the hospital during part of their recovery, the visitor guidelines for inpatient rooms apply.  Please refer to the [facility] website for the visitor guidelines for any additional information.   If you received a COVID test during your pre-op  visit  it is requested that you wear a mask when out in public, stay away from anyone that may not be feeling well and notify your surgeon if you develop symptoms. If you have been in contact with anyone that has tested positive in the last 10 days please notify you surgeon.      Pre-operative CHG Bathing Instructions   You can play a key role in reducing the risk of infection after surgery. Your skin needs to be as free of germs as possible. You can reduce the number of germs on your skin by washing with CHG (chlorhexidine  gluconate) soap before surgery. CHG is an antiseptic soap that kills germs and continues to kill germs even after washing.   DO NOT use if you have an allergy to chlorhexidine /CHG or antibacterial soaps. If your skin becomes reddened or irritated, stop using the CHG and notify one of our RNs at [phone].              TAKE A SHOWER THE NIGHT BEFORE SURGERY AND THE DAY OF SURGERY    Please keep in mind the following:  DO NOT shave, including legs and underarms, 48  hours prior to surgery.   You may shave your face before/day of surgery.  Place clean sheets on your bed the night before surgery Use a clean washcloth (not used since being washed) for each shower. DO NOT sleep with pet's night before surgery.  CHG Shower Instructions:  Wash your face and private area with normal soap. If you choose to wash your hair, wash first with your normal shampoo.  After you use shampoo/soap, rinse your hair and body thoroughly to remove shampoo/soap residue.  Turn the water OFF and apply half the bottle of CHG soap to a CLEAN washcloth.  Apply CHG soap ONLY FROM YOUR NECK DOWN TO YOUR TOES (washing for 3-5 minutes)  DO NOT use CHG  soap on face, private areas, open wounds, or sores.  Pay special attention to the area where your surgery is being performed.  If you are having back surgery, having someone wash your back for you may be helpful. Wait 2 minutes after CHG soap is applied, then you may rinse off the CHG soap.  Pat dry with a clean towel  Put on clean pajamas    Additional instructions for the day of surgery: DO NOT APPLY any lotions, deodorants, cologne, or perfumes.   Do not wear jewelry or makeup Do not wear nail polish, gel polish, artificial nails, or any other type of covering on natural nails (fingers and toes) Do not bring valuables to the hospital. [facility] is not responsible for valuables/personal belongings. Put on clean/comfortable clothes.  Please brush your teeth.  Ask your nurse before applying any prescription medications to the skin.

## 2023-08-19 ENCOUNTER — Encounter (HOSPITAL_COMMUNITY)
Admission: RE | Admit: 2023-08-19 | Discharge: 2023-08-19 | Disposition: A | Discharge status: Home / Self Care | Source: Ambulatory Visit | Attending: Thoracic Surgery (Cardiothoracic Vascular Surgery) | Admitting: Thoracic Surgery (Cardiothoracic Vascular Surgery)

## 2023-08-19 ENCOUNTER — Other Ambulatory Visit: Payer: Self-pay

## 2023-08-19 ENCOUNTER — Encounter (HOSPITAL_COMMUNITY): Payer: Self-pay

## 2023-08-19 DIAGNOSIS — R590 Localized enlarged lymph nodes: Secondary | ICD-10-CM | POA: Diagnosis not present

## 2023-08-19 DIAGNOSIS — S32019D Unspecified fracture of first lumbar vertebra, subsequent encounter for fracture with routine healing: Secondary | ICD-10-CM | POA: Diagnosis not present

## 2023-08-19 DIAGNOSIS — Z79899 Other long term (current) drug therapy: Secondary | ICD-10-CM | POA: Insufficient documentation

## 2023-08-19 DIAGNOSIS — C3481 Malignant neoplasm of overlapping sites of right bronchus and lung: Secondary | ICD-10-CM | POA: Insufficient documentation

## 2023-08-19 DIAGNOSIS — X58XXXD Exposure to other specified factors, subsequent encounter: Secondary | ICD-10-CM | POA: Insufficient documentation

## 2023-08-19 DIAGNOSIS — C3411 Malignant neoplasm of upper lobe, right bronchus or lung: Secondary | ICD-10-CM | POA: Insufficient documentation

## 2023-08-19 DIAGNOSIS — Z01812 Encounter for preprocedural laboratory examination: Secondary | ICD-10-CM | POA: Insufficient documentation

## 2023-08-19 DIAGNOSIS — Z87891 Personal history of nicotine dependence: Secondary | ICD-10-CM | POA: Diagnosis not present

## 2023-08-19 DIAGNOSIS — Z01818 Encounter for other preprocedural examination: Secondary | ICD-10-CM | POA: Diagnosis present

## 2023-08-19 HISTORY — DX: Malignant (primary) neoplasm, unspecified: C80.1

## 2023-08-19 LAB — COMPREHENSIVE METABOLIC PANEL WITH GFR
ALT: 15 U/L (ref 0–44)
AST: 23 U/L (ref 15–41)
Albumin: 3.6 g/dL (ref 3.5–5.0)
Alkaline Phosphatase: 108 U/L (ref 38–126)
Anion gap: 9 (ref 5–15)
BUN: 8 mg/dL (ref 8–23)
CO2: 28 mmol/L (ref 22–32)
Calcium: 9.2 mg/dL (ref 8.9–10.3)
Chloride: 105 mmol/L (ref 98–111)
Creatinine, Ser: 1.13 mg/dL (ref 0.61–1.24)
GFR, Estimated: >60 mL/min (ref >60)
Glucose, Bld: 99 mg/dL (ref 70–99)
Potassium: 3.4 mmol/L — ABNORMAL LOW (ref 3.5–5.1)
Sodium: 142 mmol/L (ref 135–145)
Total Bilirubin: 0.7 mg/dL (ref 0.0–1.2)
Total Protein: 7 g/dL (ref 6.5–8.1)

## 2023-08-19 LAB — CBC
HCT: 48 % (ref 39.0–52.0)
Hemoglobin: 16.3 g/dL (ref 13.0–17.0)
MCH: 29.7 pg (ref 26.0–34.0)
MCHC: 34 g/dL (ref 30.0–36.0)
MCV: 87.6 fL (ref 80.0–100.0)
Platelets: 228 10*3/uL (ref 150–400)
RBC: 5.48 MIL/uL (ref 4.22–5.81)
RDW: 12.6 % (ref 11.5–15.5)
WBC: 9.8 10*3/uL (ref 4.0–10.5)
nRBC: 0 % (ref 0.0–0.2)

## 2023-08-19 LAB — PROTIME-INR
INR: 1 (ref 0.8–1.2)
Prothrombin Time: 13.6 s (ref 11.4–15.2)

## 2023-08-19 LAB — APTT: aPTT: 30 s (ref 24–36)

## 2023-08-19 NOTE — Progress Notes (Signed)
 PCP - Fredick Jarred, PA-C Cardiologist - Pt was seeing Dr. Antionette Kirks in 2021 for PAD and chest pain. Stress test resulted abnormal, but follow-up cath normal with no additional interventions needed. Pt did not want to see Dr. Nadia Aurora anymore calling him a "criminal." Pulmonologist - Pt saw Dr. Celene Coins in 2021, but declined continuation of care after he completed CXR and his CT Chest.  PPM/ICD - Denies Device Orders - n/a Rep Notified - n/a  Chest x-ray - Pt will have one completed DOS EKG - 03/02/2023 Stress Test - 07/19/2019 ECHO - Denies Cardiac Cath - 07/27/2019  Sleep Study - Denies CPAP - n/a  No DM  Last dose of GLP1 agonist- n/a GLP1 instructions: n/a  Blood Thinner Instructions: n/a Aspirin  Instructions: Pt instructed to continue taking ASA through the day before surgery and none the morning of surgery  NPO after midnight  COVID TEST- n/a   Anesthesia review: No.  Patient denies shortness of breath, fever, cough and chest pain at PAT appointment. Pt denies any respiratory illness/infection in the last two months.    All instructions explained to the patient, with a verbal understanding of the material. Patient agrees to go over the instructions while at home for a better understanding. Patient also instructed to self quarantine after being tested for COVID-19. The opportunity to ask questions was provided.

## 2023-08-24 ENCOUNTER — Ambulatory Visit (HOSPITAL_COMMUNITY)

## 2023-08-24 ENCOUNTER — Encounter (HOSPITAL_COMMUNITY): Payer: Self-pay | Admitting: Thoracic Surgery (Cardiothoracic Vascular Surgery)

## 2023-08-24 ENCOUNTER — Ambulatory Visit (HOSPITAL_BASED_OUTPATIENT_CLINIC_OR_DEPARTMENT_OTHER): Admitting: Certified Registered Nurse Anesthetist

## 2023-08-24 ENCOUNTER — Ambulatory Visit (HOSPITAL_COMMUNITY)
Admission: RE | Admit: 2023-08-24 | Discharge: 2023-08-24 | Disposition: A | Discharge status: Home / Self Care | Attending: Thoracic Surgery (Cardiothoracic Vascular Surgery) | Admitting: Thoracic Surgery (Cardiothoracic Vascular Surgery)

## 2023-08-24 ENCOUNTER — Encounter (HOSPITAL_BASED_OUTPATIENT_CLINIC_OR_DEPARTMENT_OTHER)
Admission: RE | Disposition: A | Discharge status: Home / Self Care | Payer: Self-pay | Source: Home / Self Care | Attending: Thoracic Surgery (Cardiothoracic Vascular Surgery)

## 2023-08-24 ENCOUNTER — Ambulatory Visit (HOSPITAL_COMMUNITY): Admitting: Certified Registered Nurse Anesthetist

## 2023-08-24 DIAGNOSIS — J4489 Other specified chronic obstructive pulmonary disease: Secondary | ICD-10-CM | POA: Insufficient documentation

## 2023-08-24 DIAGNOSIS — R918 Other nonspecific abnormal finding of lung field: Secondary | ICD-10-CM | POA: Diagnosis not present

## 2023-08-24 DIAGNOSIS — M1990 Unspecified osteoarthritis, unspecified site: Secondary | ICD-10-CM | POA: Insufficient documentation

## 2023-08-24 DIAGNOSIS — C801 Malignant (primary) neoplasm, unspecified: Secondary | ICD-10-CM | POA: Diagnosis not present

## 2023-08-24 DIAGNOSIS — Z95820 Peripheral vascular angioplasty status with implants and grafts: Secondary | ICD-10-CM | POA: Insufficient documentation

## 2023-08-24 DIAGNOSIS — F418 Other specified anxiety disorders: Secondary | ICD-10-CM | POA: Diagnosis not present

## 2023-08-24 DIAGNOSIS — Z85118 Personal history of other malignant neoplasm of bronchus and lung: Secondary | ICD-10-CM | POA: Diagnosis not present

## 2023-08-24 DIAGNOSIS — J449 Chronic obstructive pulmonary disease, unspecified: Secondary | ICD-10-CM | POA: Diagnosis not present

## 2023-08-24 DIAGNOSIS — R590 Localized enlarged lymph nodes: Secondary | ICD-10-CM

## 2023-08-24 DIAGNOSIS — C383 Malignant neoplasm of mediastinum, part unspecified: Secondary | ICD-10-CM

## 2023-08-24 DIAGNOSIS — Z79899 Other long term (current) drug therapy: Secondary | ICD-10-CM | POA: Insufficient documentation

## 2023-08-24 DIAGNOSIS — E785 Hyperlipidemia, unspecified: Secondary | ICD-10-CM | POA: Diagnosis not present

## 2023-08-24 DIAGNOSIS — I739 Peripheral vascular disease, unspecified: Secondary | ICD-10-CM | POA: Diagnosis not present

## 2023-08-24 DIAGNOSIS — C771 Secondary and unspecified malignant neoplasm of intrathoracic lymph nodes: Secondary | ICD-10-CM | POA: Insufficient documentation

## 2023-08-24 DIAGNOSIS — K219 Gastro-esophageal reflux disease without esophagitis: Secondary | ICD-10-CM | POA: Diagnosis not present

## 2023-08-24 DIAGNOSIS — Z87891 Personal history of nicotine dependence: Secondary | ICD-10-CM | POA: Diagnosis not present

## 2023-08-24 DIAGNOSIS — F32A Depression, unspecified: Secondary | ICD-10-CM | POA: Insufficient documentation

## 2023-08-24 DIAGNOSIS — F419 Anxiety disorder, unspecified: Secondary | ICD-10-CM | POA: Insufficient documentation

## 2023-08-24 DIAGNOSIS — Z01818 Encounter for other preprocedural examination: Secondary | ICD-10-CM | POA: Diagnosis not present

## 2023-08-24 HISTORY — PX: VIDEO BRONCHOSCOPY WITH ENDOBRONCHIAL ULTRASOUND: SHX6177

## 2023-08-24 SURGERY — BRONCHOSCOPY, WITH EBUS
Anesthesia: General | Site: Mouth

## 2023-08-24 MED ORDER — CHLORHEXIDINE GLUCONATE 0.12 % MT SOLN
OROMUCOSAL | Status: AC
  Administered 2023-08-24: 15 mL via OROMUCOSAL
  Filled 2023-08-24: qty 15

## 2023-08-24 MED ORDER — FENTANYL CITRATE (PF) 250 MCG/5ML IJ SOLN
INTRAMUSCULAR | Status: AC
  Filled 2023-08-24: qty 5

## 2023-08-24 MED ORDER — LACTATED RINGERS IV SOLN: INTRAVENOUS | Status: DC

## 2023-08-24 MED ORDER — PHENYLEPHRINE HCL-NACL 20-0.9 MG/250ML-% IV SOLN
INTRAVENOUS | Status: DC | PRN
  Administered 2023-08-24: 25 ug/min via INTRAVENOUS

## 2023-08-24 MED ORDER — DEXAMETHASONE SODIUM PHOSPHATE 10 MG/ML IJ SOLN
INTRAMUSCULAR | Status: DC | PRN
  Administered 2023-08-24: 10 mg via INTRAVENOUS

## 2023-08-24 MED ORDER — PHENYLEPHRINE 80 MCG/ML (10ML) SYRINGE FOR IV PUSH (FOR BLOOD PRESSURE SUPPORT)
PREFILLED_SYRINGE | INTRAVENOUS | Status: AC
  Filled 2023-08-24: qty 10

## 2023-08-24 MED ORDER — 0.9 % SODIUM CHLORIDE (POUR BTL) OPTIME
TOPICAL | Status: DC | PRN
  Administered 2023-08-24: 1000 mL

## 2023-08-24 MED ORDER — ACETAMINOPHEN 10 MG/ML IV SOLN: 1000.0000 mg | Freq: Once | INTRAVENOUS | Status: DC | PRN

## 2023-08-24 MED ORDER — EPINEPHRINE PF 1 MG/ML IJ SOLN
INTRAMUSCULAR | Status: DC | PRN
  Administered 2023-08-24: 1 mg via ENDOTRACHEOPULMONARY

## 2023-08-24 MED ORDER — SUGAMMADEX SODIUM 200 MG/2ML IV SOLN
INTRAVENOUS | Status: DC | PRN
  Administered 2023-08-24: 200 mg via INTRAVENOUS

## 2023-08-24 MED ORDER — ONDANSETRON HCL 4 MG/2ML IJ SOLN
INTRAMUSCULAR | Status: DC | PRN
  Administered 2023-08-24: 4 mg via INTRAVENOUS

## 2023-08-24 MED ORDER — ROCURONIUM BROMIDE 10 MG/ML (PF) SYRINGE
PREFILLED_SYRINGE | INTRAVENOUS | Status: DC | PRN
  Administered 2023-08-24: 60 mg via INTRAVENOUS

## 2023-08-24 MED ORDER — LIDOCAINE 2% (20 MG/ML) 5 ML SYRINGE
INTRAMUSCULAR | Status: DC | PRN
  Administered 2023-08-24: 80 mg via INTRAVENOUS

## 2023-08-24 MED ORDER — ORAL CARE MOUTH RINSE: 15.0000 mL | Freq: Once | OROMUCOSAL | Status: AC

## 2023-08-24 MED ORDER — LIDOCAINE 2% (20 MG/ML) 5 ML SYRINGE
INTRAMUSCULAR | Status: AC
  Filled 2023-08-24: qty 5

## 2023-08-24 MED ORDER — PROPOFOL 10 MG/ML IV BOLUS
INTRAVENOUS | Status: DC | PRN
  Administered 2023-08-24 (×2): 20 mg via INTRAVENOUS
  Administered 2023-08-24: 160 mg via INTRAVENOUS

## 2023-08-24 MED ORDER — SUCCINYLCHOLINE CHLORIDE 200 MG/10ML IV SOSY
PREFILLED_SYRINGE | INTRAVENOUS | Status: AC
  Filled 2023-08-24: qty 10

## 2023-08-24 MED ORDER — MIDAZOLAM HCL 2 MG/2ML IJ SOLN
INTRAMUSCULAR | Status: AC
  Filled 2023-08-24: qty 2

## 2023-08-24 MED ORDER — MIDAZOLAM HCL 5 MG/5ML IJ SOLN
INTRAMUSCULAR | Status: DC | PRN
  Administered 2023-08-24: 1 mg via INTRAVENOUS

## 2023-08-24 MED ORDER — CHLORHEXIDINE GLUCONATE 0.12 % MT SOLN: 15.0000 mL | Freq: Once | OROMUCOSAL | Status: AC

## 2023-08-24 MED ORDER — EPINEPHRINE PF 1 MG/ML IJ SOLN
INTRAMUSCULAR | Status: AC
  Filled 2023-08-24: qty 1

## 2023-08-24 MED ORDER — FENTANYL CITRATE (PF) 250 MCG/5ML IJ SOLN
INTRAMUSCULAR | Status: DC | PRN
  Administered 2023-08-24 (×2): 50 ug via INTRAVENOUS

## 2023-08-24 MED ORDER — FENTANYL CITRATE (PF) 100 MCG/2ML IJ SOLN: 25.0000 ug | INTRAMUSCULAR | Status: DC | PRN

## 2023-08-24 MED ORDER — ROCURONIUM BROMIDE 10 MG/ML (PF) SYRINGE
PREFILLED_SYRINGE | INTRAVENOUS | Status: AC
  Filled 2023-08-24: qty 10

## 2023-08-24 MED ORDER — PROPOFOL 10 MG/ML IV BOLUS
INTRAVENOUS | Status: AC
  Filled 2023-08-24: qty 20

## 2023-08-24 SURGICAL SUPPLY — 36 items
ADAPTER VALVE BIOPSY EBUS (MISCELLANEOUS) IMPLANT
BRUSH CYTOL CELLEBRITY 1.5X140 (MISCELLANEOUS) IMPLANT
CANISTER SUCTION 3000ML PPV (SUCTIONS) ×1 IMPLANT
CNTNR URN SCR LID CUP LEK RST (MISCELLANEOUS) ×1 IMPLANT
COVER BACK TABLE 60X90IN (DRAPES) ×1 IMPLANT
FILTER STRAW FLUID ASPIR (MISCELLANEOUS) IMPLANT
FORCEPS BIOP RJ4 1.8 (CUTTING FORCEPS) IMPLANT
FORCEPS RADIAL JAW LRG 4 PULM (INSTRUMENTS) IMPLANT
GAUZE 4X4 16PLY ~~LOC~~+RFID DBL (SPONGE) ×1 IMPLANT
GAUZE SPONGE 4X4 12PLY STRL (GAUZE/BANDAGES/DRESSINGS) IMPLANT
GLOVE SS BIOGEL STRL SZ 7.5 (GLOVE) ×2 IMPLANT
GOWN STRL REUS W/ TWL XL LVL3 (GOWN DISPOSABLE) ×1 IMPLANT
KIT CLEAN ENDO COMPLIANCE (KITS) ×2 IMPLANT
KIT TURNOVER KIT B (KITS) ×1 IMPLANT
MARKER SKIN DUAL TIP RULER LAB (MISCELLANEOUS) ×1 IMPLANT
NDL ASPIRATION VIZISHOT 19G (NEEDLE) IMPLANT
NDL ASPIRATION VIZISHOT 21G (NEEDLE) ×1 IMPLANT
NDL BLUNT 18X1 FOR OR ONLY (NEEDLE) IMPLANT
NEEDLE ASPIRATION VIZISHOT 19G (NEEDLE) ×1 IMPLANT
NEEDLE ASPIRATION VIZISHOT 21G (NEEDLE) IMPLANT
NEEDLE BLUNT 18X1 FOR OR ONLY (NEEDLE) IMPLANT
NS IRRIG 1000ML POUR BTL (IV SOLUTION) ×1 IMPLANT
OIL SILICONE PENTAX (PARTS (SERVICE/REPAIRS)) ×1 IMPLANT
PAD ARMBOARD POSITIONER FOAM (MISCELLANEOUS) ×2 IMPLANT
SYR 20ML ECCENTRIC (SYRINGE) ×2 IMPLANT
SYR 20ML LL LF (SYRINGE) ×1 IMPLANT
SYR 3ML LL SCALE MARK (SYRINGE) IMPLANT
SYR 5ML LL (SYRINGE) ×1 IMPLANT
SYR 5ML LUER SLIP (SYRINGE) ×1 IMPLANT
TOWEL GREEN STERILE (TOWEL DISPOSABLE) ×1 IMPLANT
TOWEL GREEN STERILE FF (TOWEL DISPOSABLE) ×1 IMPLANT
TRAP SPECIMEN MUCUS 40CC (MISCELLANEOUS) ×1 IMPLANT
TUBE CONNECTING 20X1/4 (TUBING) ×1 IMPLANT
VALVE BIOPSY SINGLE USE (MISCELLANEOUS) ×1 IMPLANT
VALVE SUCTION BRONCHIO DISP (MISCELLANEOUS) ×1 IMPLANT
WATER STERILE IRR 1000ML POUR (IV SOLUTION) ×1 IMPLANT

## 2023-08-24 NOTE — Anesthesia Procedure Notes (Signed)
 Procedure Name: Intubation Date/Time: 08/24/2023 12:21 PM  Performed by: Colen Daunt, CRNAPre-anesthesia Checklist: Patient identified, Emergency Drugs available, Suction available and Patient being monitored Patient Re-evaluated:Patient Re-evaluated prior to induction Oxygen Delivery Method: Circle System Utilized Preoxygenation: Pre-oxygenation with 100% oxygen Induction Type: IV induction Ventilation: Mask ventilation without difficulty and Oral airway inserted - appropriate to patient size Laryngoscope Size: Annabell Key and 2 Grade View: Grade I Tube type: Oral Tube size: 8.5 mm Number of attempts: 1 Airway Equipment and Method: Stylet and Oral airway Placement Confirmation: ETT inserted through vocal cords under direct vision, positive ETCO2 and breath sounds checked- equal and bilateral Secured at: 23 cm Tube secured with: Tape Dental Injury: Teeth and Oropharynx as per pre-operative assessment

## 2023-08-24 NOTE — Anesthesia Preprocedure Evaluation (Signed)
 Anesthesia Evaluation  Patient identified by MRN, date of birth, ID band Patient awake    Reviewed: Allergy & Precautions, NPO status , Patient's Chart, lab work & pertinent test results  Airway Mallampati: II  TM Distance: >3 FB Neck ROM: Full    Dental no notable dental hx.    Pulmonary asthma , COPD, former smoker Mediastinal adenopathy    Pulmonary exam normal        Cardiovascular + Peripheral Vascular Disease   Rhythm:Regular Rate:Normal     Neuro/Psych   Anxiety Depression       GI/Hepatic Neg liver ROS,GERD  Medicated,,  Endo/Other  negative endocrine ROS    Renal/GU negative Renal ROS  negative genitourinary   Musculoskeletal  (+) Arthritis , Osteoarthritis,    Abdominal Normal abdominal exam  (+)   Peds  Hematology Lab Results      Component                Value               Date                      WBC                      9.8                 08/19/2023                HGB                      16.3                08/19/2023                HCT                      48.0                08/19/2023                MCV                      87.6                08/19/2023                PLT                      228                 08/19/2023              Anesthesia Other Findings   Reproductive/Obstetrics                             Anesthesia Physical Anesthesia Plan  ASA: 3  Anesthesia Plan: General   Post-op Pain Management:    Induction: Intravenous  PONV Risk Score and Plan: 2 and Ondansetron , Dexamethasone  and Treatment may vary due to age or medical condition  Airway Management Planned: Mask and Oral ETT  Additional Equipment: None  Intra-op Plan:   Post-operative Plan: Extubation in OR  Informed Consent: I have reviewed the patients History and Physical, chart, labs and discussed the procedure including the risks, benefits and alternatives for the proposed  anesthesia with  the patient or authorized representative who has indicated his/her understanding and acceptance.     Dental advisory given  Plan Discussed with: CRNA  Anesthesia Plan Comments:        Anesthesia Quick Evaluation

## 2023-08-24 NOTE — Transfer of Care (Signed)
 Immediate Anesthesia Transfer of Care Note  Patient: Terrence Swanson  Procedure(s) Performed: BRONCHOSCOPY, WITH ENDOBRONCHIAL ULTRASOUND (Mouth)  Patient Location: PACU  Anesthesia Type:General  Level of Consciousness: awake, alert , oriented, and patient cooperative  Airway & Oxygen Therapy: Patient Spontanous Breathing and Patient connected to face mask oxygen  Post-op Assessment: Report given to RN, Post -op Vital signs reviewed and stable, and Patient moving all extremities X 4  Post vital signs: Reviewed and stable  Last Vitals:  Vitals Value Taken Time  BP 135/102 08/24/23 1302  Temp    Pulse 80 08/24/23 1305  Resp 14 08/24/23 1305  SpO2 95 % 08/24/23 1305  Vitals shown include unfiled device data.  Last Pain:  Vitals:   08/24/23 1020  TempSrc:   PainSc: 0-No pain         Complications: No notable events documented.

## 2023-08-24 NOTE — Interval H&P Note (Signed)
 History and Physical Interval Note:  08/24/2023 11:34 AM  Terrence Swanson  has presented today for surgery, with the diagnosis of MEDIASTINAL ADENOPATHY.  The various methods of treatment have been discussed with the patient and family. After consideration of risks, benefits and other options for treatment, the patient has consented to  Procedure(s): BRONCHOSCOPY, WITH EBUS (N/A) as a surgical intervention.  The patient's history has been reviewed, patient examined, no change in status, stable for surgery.  I have reviewed the patient's chart and labs.  Questions were answered to the patient's satisfaction.     Zelphia Higashi

## 2023-08-24 NOTE — Brief Op Note (Signed)
 08/24/2023  1:02 PM  PATIENT:  Terrence Swanson  63 y.o. male  PRE-OPERATIVE DIAGNOSIS:  MEDIASTINAL ADENOPATHY  POST-OPERATIVE DIAGNOSIS:  MEDIASTINAL ADENOPATHY  PROCEDURE:  Procedure(s): BRONCHOSCOPY, WITH ENDOBRONCHIAL ULTRASOUND (N/A) Transbronchial needle aspirations  SURGEON:  Surgeons and Role:    * Zelphia Higashi, MD - Primary  PHYSICIAN ASSISTANT:   ASSISTANTS: none   ANESTHESIA:   general  EBL:  minimal   BLOOD ADMINISTERED:none  DRAINS: none   LOCAL MEDICATIONS USED:  NONE  SPECIMEN:  Source of Specimen:  level 7 node  DISPOSITION OF SPECIMEN:  PATHOLOGY  COUNTS:  NO endoscopic  TOURNIQUET:  * No tourniquets in log *  DICTATION: .Other Dictation: Dictation Number -  PLAN OF CARE: Discharge to home after PACU  PATIENT DISPOSITION:  PACU - hemodynamically stable.   Delay start of Pharmacological VTE agent (>24hrs) due to surgical blood loss or risk of bleeding: not applicable

## 2023-08-24 NOTE — Discharge Instructions (Addendum)
 Do not drive or engage in heavy physical activity for 24 hours.  You may cough up small amounts of blood over the next few days.  Call [phone] if you cough up more than a tablespoon of blood.  You may use acetaminophen  (Tylenol ) if needed for discomfort.  Do not exceed the maximum dosage recommended on the packaging.  You may use an over the counter cough medication or throat lozenge if needed.  Follow up with Drs. Morris and Mancusi-Ungaro in [place].  You do not need to follow up with me unless you are having issues related to the procedure.  Call [phone] if you develop chest pain, shortness of breath or cough up more than a tablespoon of blood.

## 2023-08-25 ENCOUNTER — Encounter (HOSPITAL_COMMUNITY): Payer: Self-pay | Admitting: Thoracic Surgery (Cardiothoracic Vascular Surgery)

## 2023-08-25 NOTE — Anesthesia Postprocedure Evaluation (Signed)
 Anesthesia Post Note  Patient: Terrence Swanson  Procedure(s) Performed: BRONCHOSCOPY, WITH ENDOBRONCHIAL ULTRASOUND (Mouth)     Patient location during evaluation: PACU Anesthesia Type: General Level of consciousness: awake and alert Pain management: pain level controlled Vital Signs Assessment: post-procedure vital signs reviewed and stable Respiratory status: spontaneous breathing, nonlabored ventilation, respiratory function stable and patient connected to nasal cannula oxygen Cardiovascular status: blood pressure returned to baseline and stable Postop Assessment: no apparent nausea or vomiting Anesthetic complications: no   No notable events documented.  Last Vitals:  Vitals:   08/24/23 1330 08/24/23 1345  BP: 132/73 128/67  Pulse: 68 69  Resp: 18 (!) 22  Temp:    SpO2: 93% 93%    Last Pain:  Vitals:   08/24/23 1305  TempSrc:   PainSc: 0-No pain                 Theotis Flake P Delani Kohli

## 2023-08-25 NOTE — Op Note (Signed)
 Terrence Swanson, MCNUTT MEDICAL RECORD NO: [id number] ACCOUNT NO: [id number] DATE OF BIRTH: 1960/07/05 FACILITY: [facility] LOCATION: [facility] PHYSICIAN: Milon Aloe. Luna Salinas, MD  Operative Report   DATE OF PROCEDURE: 08/24/2023  PREOPERATIVE DIAGNOSIS:  Mediastinal adenopathy.  POSTOPERATIVE DIAGNOSIS:  Metastatic adenocarcinoma.  PROCEDURE:  Flexible fiberoptic bronchoscopy and endobronchial ultrasound with transbronchial needle aspirations of mediastinal lymph node.  SURGEON:  Milon Aloe. Luna Salinas, MD.  ASSISTANT:  None.  ANESTHESIA:  General.  FINDINGS:  Markedly enlarged level VII node.  Initial aspirations showed adequate specimen on quick prep for diagnosis.  CLINICAL NOTE:  [name] is a 63 year old gentleman with a history of tobacco use who had nodules in the right upper and middle lobes found on lung cancer screening.  Both were hypermetabolic on PET.  Biopsies showed adenocarcinoma.  He was offered surgical resection but declined and opted for stereotactic radiation.  Recently, he has had a restaging PET CT which showed new right hilar and mediastinal adenopathy involving level 10 and level 7 lymph nodes.  He was advised to undergo endobronchial ultrasound for diagnostic purposes.  The indications, risks, benefits, and alternatives were discussed in detail with the patient.  He understood and accepted the risks and agreed to proceed.  OPERATIVE NOTE:  [name] was brought to the operating room on 08/24/2023.  He had induction of general anesthesia and was intubated.  Sequential compression devices were placed on the calves for DVT prophylaxis.  A Bair Hugger was placed for active warming.  A timeout was performed.    Flexible fiberoptic bronchoscopy was performed via the endotracheal tube.  It revealed normal endobronchial anatomy with no endobronchial lesions to the level of the subsegmental bronchi.  There was a small amount of  thick clear secretions which cleared with saline.  The  bronchoscope was removed.  The endobronchial ultrasound probe then was placed and was advanced to the carina.  There was a large level VII node easily identified.  Aspirations were performed using a 19-gauge needle.  Aspirations were obtained both with and without suction applied, and the specimens were prepared for quick prep.  Quick prep on the first specimen showed that it was adequate for diagnosis.  Three additional aspirations were performed with all of the specimen being placed into cell block in hopes of having adequate tissue for molecular testing.  There was some minimal bleeding with the biopsies.  Topical dilute epinephrine  solution was applied, and the blood was irrigated with saline and removed.  There was no ongoing bleeding at the end of the procedure.  The patient then was extubated in the operating room and taken to the post-anesthesia care unit in good condition.  There were no intraoperative complications.    MUK D: 08/24/2023 3:29:23 pm T: 08/25/2023 1:18:00 am  JOB: [id number]/ [id number]

## 2023-08-26 LAB — CYTOLOGY - NON PAP

## 2023-08-31 DIAGNOSIS — M48061 Spinal stenosis, lumbar region without neurogenic claudication: Secondary | ICD-10-CM | POA: Diagnosis not present

## 2023-08-31 DIAGNOSIS — R748 Abnormal levels of other serum enzymes: Secondary | ICD-10-CM | POA: Diagnosis not present

## 2023-08-31 DIAGNOSIS — C3481 Malignant neoplasm of overlapping sites of right bronchus and lung: Secondary | ICD-10-CM | POA: Diagnosis not present

## 2023-08-31 DIAGNOSIS — C3411 Malignant neoplasm of upper lobe, right bronchus or lung: Secondary | ICD-10-CM | POA: Diagnosis not present

## 2023-08-31 DIAGNOSIS — M47814 Spondylosis without myelopathy or radiculopathy, thoracic region: Secondary | ICD-10-CM | POA: Diagnosis not present

## 2023-08-31 DIAGNOSIS — J984 Other disorders of lung: Secondary | ICD-10-CM | POA: Diagnosis not present

## 2023-08-31 DIAGNOSIS — Z923 Personal history of irradiation: Secondary | ICD-10-CM | POA: Diagnosis not present

## 2023-08-31 DIAGNOSIS — R590 Localized enlarged lymph nodes: Secondary | ICD-10-CM | POA: Diagnosis not present

## 2023-09-02 DIAGNOSIS — C3491 Malignant neoplasm of unspecified part of right bronchus or lung: Secondary | ICD-10-CM | POA: Diagnosis not present

## 2023-09-02 DIAGNOSIS — Z6824 Body mass index (BMI) 24.0-24.9, adult: Secondary | ICD-10-CM | POA: Diagnosis not present

## 2023-09-02 DIAGNOSIS — R590 Localized enlarged lymph nodes: Secondary | ICD-10-CM | POA: Diagnosis not present

## 2023-09-02 DIAGNOSIS — Z923 Personal history of irradiation: Secondary | ICD-10-CM | POA: Diagnosis not present

## 2023-09-02 DIAGNOSIS — C771 Secondary and unspecified malignant neoplasm of intrathoracic lymph nodes: Secondary | ICD-10-CM | POA: Diagnosis not present

## 2023-09-02 DIAGNOSIS — M47814 Spondylosis without myelopathy or radiculopathy, thoracic region: Secondary | ICD-10-CM | POA: Diagnosis not present

## 2023-09-02 DIAGNOSIS — Z1389 Encounter for screening for other disorder: Secondary | ICD-10-CM | POA: Diagnosis not present

## 2023-09-02 DIAGNOSIS — C3481 Malignant neoplasm of overlapping sites of right bronchus and lung: Secondary | ICD-10-CM | POA: Diagnosis not present

## 2023-09-02 DIAGNOSIS — Z0001 Encounter for general adult medical examination with abnormal findings: Secondary | ICD-10-CM | POA: Diagnosis not present

## 2023-09-02 DIAGNOSIS — J984 Other disorders of lung: Secondary | ICD-10-CM | POA: Diagnosis not present

## 2023-09-02 DIAGNOSIS — Z0000 Encounter for general adult medical examination without abnormal findings: Secondary | ICD-10-CM | POA: Diagnosis not present

## 2023-09-02 DIAGNOSIS — M48061 Spinal stenosis, lumbar region without neurogenic claudication: Secondary | ICD-10-CM | POA: Diagnosis not present

## 2023-09-02 DIAGNOSIS — R748 Abnormal levels of other serum enzymes: Secondary | ICD-10-CM | POA: Diagnosis not present

## 2023-09-02 DIAGNOSIS — K219 Gastro-esophageal reflux disease without esophagitis: Secondary | ICD-10-CM | POA: Diagnosis not present

## 2023-09-02 DIAGNOSIS — E782 Mixed hyperlipidemia: Secondary | ICD-10-CM | POA: Diagnosis not present

## 2023-09-05 DIAGNOSIS — C3481 Malignant neoplasm of overlapping sites of right bronchus and lung: Secondary | ICD-10-CM | POA: Diagnosis not present

## 2023-09-07 ENCOUNTER — Encounter (HOSPITAL_COMMUNITY): Payer: Self-pay

## 2023-09-07 DIAGNOSIS — Z452 Encounter for adjustment and management of vascular access device: Secondary | ICD-10-CM | POA: Diagnosis not present

## 2023-09-09 DIAGNOSIS — Z923 Personal history of irradiation: Secondary | ICD-10-CM | POA: Diagnosis not present

## 2023-09-09 DIAGNOSIS — C3481 Malignant neoplasm of overlapping sites of right bronchus and lung: Secondary | ICD-10-CM | POA: Diagnosis not present

## 2023-09-09 DIAGNOSIS — R590 Localized enlarged lymph nodes: Secondary | ICD-10-CM | POA: Diagnosis not present

## 2023-09-09 DIAGNOSIS — R748 Abnormal levels of other serum enzymes: Secondary | ICD-10-CM | POA: Diagnosis not present

## 2023-09-09 DIAGNOSIS — C771 Secondary and unspecified malignant neoplasm of intrathoracic lymph nodes: Secondary | ICD-10-CM | POA: Diagnosis not present

## 2023-09-09 DIAGNOSIS — M47814 Spondylosis without myelopathy or radiculopathy, thoracic region: Secondary | ICD-10-CM | POA: Diagnosis not present

## 2023-09-09 DIAGNOSIS — M48061 Spinal stenosis, lumbar region without neurogenic claudication: Secondary | ICD-10-CM | POA: Diagnosis not present

## 2023-09-09 DIAGNOSIS — J984 Other disorders of lung: Secondary | ICD-10-CM | POA: Diagnosis not present

## 2023-09-12 DIAGNOSIS — R748 Abnormal levels of other serum enzymes: Secondary | ICD-10-CM | POA: Diagnosis not present

## 2023-09-12 DIAGNOSIS — J984 Other disorders of lung: Secondary | ICD-10-CM | POA: Diagnosis not present

## 2023-09-12 DIAGNOSIS — M47814 Spondylosis without myelopathy or radiculopathy, thoracic region: Secondary | ICD-10-CM | POA: Diagnosis not present

## 2023-09-12 DIAGNOSIS — C771 Secondary and unspecified malignant neoplasm of intrathoracic lymph nodes: Secondary | ICD-10-CM | POA: Diagnosis not present

## 2023-09-12 DIAGNOSIS — M48061 Spinal stenosis, lumbar region without neurogenic claudication: Secondary | ICD-10-CM | POA: Diagnosis not present

## 2023-09-12 DIAGNOSIS — C3481 Malignant neoplasm of overlapping sites of right bronchus and lung: Secondary | ICD-10-CM | POA: Diagnosis not present

## 2023-09-12 DIAGNOSIS — R590 Localized enlarged lymph nodes: Secondary | ICD-10-CM | POA: Diagnosis not present

## 2023-09-12 DIAGNOSIS — Z923 Personal history of irradiation: Secondary | ICD-10-CM | POA: Diagnosis not present

## 2023-09-13 DIAGNOSIS — Z7982 Long term (current) use of aspirin: Secondary | ICD-10-CM | POA: Diagnosis not present

## 2023-09-13 DIAGNOSIS — Z79899 Other long term (current) drug therapy: Secondary | ICD-10-CM | POA: Diagnosis not present

## 2023-09-13 DIAGNOSIS — C3411 Malignant neoplasm of upper lobe, right bronchus or lung: Secondary | ICD-10-CM | POA: Diagnosis not present

## 2023-09-13 DIAGNOSIS — C3481 Malignant neoplasm of overlapping sites of right bronchus and lung: Secondary | ICD-10-CM | POA: Diagnosis not present

## 2023-09-13 DIAGNOSIS — K219 Gastro-esophageal reflux disease without esophagitis: Secondary | ICD-10-CM | POA: Diagnosis not present

## 2023-09-13 DIAGNOSIS — Z87891 Personal history of nicotine dependence: Secondary | ICD-10-CM | POA: Diagnosis not present

## 2023-09-13 DIAGNOSIS — C3490 Malignant neoplasm of unspecified part of unspecified bronchus or lung: Secondary | ICD-10-CM | POA: Diagnosis not present

## 2023-09-13 DIAGNOSIS — E785 Hyperlipidemia, unspecified: Secondary | ICD-10-CM | POA: Diagnosis not present

## 2023-09-13 DIAGNOSIS — Z452 Encounter for adjustment and management of vascular access device: Secondary | ICD-10-CM | POA: Diagnosis not present

## 2023-09-13 DIAGNOSIS — J449 Chronic obstructive pulmonary disease, unspecified: Secondary | ICD-10-CM | POA: Diagnosis not present

## 2023-09-13 DIAGNOSIS — I2510 Atherosclerotic heart disease of native coronary artery without angina pectoris: Secondary | ICD-10-CM | POA: Diagnosis not present

## 2023-09-14 NOTE — Progress Notes (Signed)
 I reached out to [facility] to discuss the PA that [facility] medicine is requiring for the pt's molecular testing. Spoke to representative Etha Henle reference number [phone]. Per Anastacio Balm, the CPT code [phone] provided by [facility] does NOT require a PA. I sent an email to [employer], Anthoney Batch via [employer] portal and am currently awaiting a reply for guidance.

## 2023-09-15 DIAGNOSIS — Z5111 Encounter for antineoplastic chemotherapy: Secondary | ICD-10-CM | POA: Diagnosis not present

## 2023-09-15 DIAGNOSIS — R590 Localized enlarged lymph nodes: Secondary | ICD-10-CM | POA: Diagnosis not present

## 2023-09-15 DIAGNOSIS — C771 Secondary and unspecified malignant neoplasm of intrathoracic lymph nodes: Secondary | ICD-10-CM | POA: Diagnosis not present

## 2023-09-15 DIAGNOSIS — Z923 Personal history of irradiation: Secondary | ICD-10-CM | POA: Diagnosis not present

## 2023-09-15 DIAGNOSIS — M48061 Spinal stenosis, lumbar region without neurogenic claudication: Secondary | ICD-10-CM | POA: Diagnosis not present

## 2023-09-15 DIAGNOSIS — J984 Other disorders of lung: Secondary | ICD-10-CM | POA: Diagnosis not present

## 2023-09-15 DIAGNOSIS — R748 Abnormal levels of other serum enzymes: Secondary | ICD-10-CM | POA: Diagnosis not present

## 2023-09-15 DIAGNOSIS — C3481 Malignant neoplasm of overlapping sites of right bronchus and lung: Secondary | ICD-10-CM | POA: Diagnosis not present

## 2023-09-15 DIAGNOSIS — M47814 Spondylosis without myelopathy or radiculopathy, thoracic region: Secondary | ICD-10-CM | POA: Diagnosis not present

## 2023-09-16 DIAGNOSIS — Z923 Personal history of irradiation: Secondary | ICD-10-CM | POA: Diagnosis not present

## 2023-09-16 DIAGNOSIS — J984 Other disorders of lung: Secondary | ICD-10-CM | POA: Diagnosis not present

## 2023-09-16 DIAGNOSIS — C3481 Malignant neoplasm of overlapping sites of right bronchus and lung: Secondary | ICD-10-CM | POA: Diagnosis not present

## 2023-09-16 DIAGNOSIS — R748 Abnormal levels of other serum enzymes: Secondary | ICD-10-CM | POA: Diagnosis not present

## 2023-09-16 DIAGNOSIS — C771 Secondary and unspecified malignant neoplasm of intrathoracic lymph nodes: Secondary | ICD-10-CM | POA: Diagnosis not present

## 2023-09-16 DIAGNOSIS — R590 Localized enlarged lymph nodes: Secondary | ICD-10-CM | POA: Diagnosis not present

## 2023-09-16 DIAGNOSIS — M47814 Spondylosis without myelopathy or radiculopathy, thoracic region: Secondary | ICD-10-CM | POA: Diagnosis not present

## 2023-09-16 DIAGNOSIS — M48061 Spinal stenosis, lumbar region without neurogenic claudication: Secondary | ICD-10-CM | POA: Diagnosis not present

## 2023-09-19 DIAGNOSIS — M47814 Spondylosis without myelopathy or radiculopathy, thoracic region: Secondary | ICD-10-CM | POA: Diagnosis not present

## 2023-09-19 DIAGNOSIS — J984 Other disorders of lung: Secondary | ICD-10-CM | POA: Diagnosis not present

## 2023-09-19 DIAGNOSIS — R748 Abnormal levels of other serum enzymes: Secondary | ICD-10-CM | POA: Diagnosis not present

## 2023-09-19 DIAGNOSIS — R590 Localized enlarged lymph nodes: Secondary | ICD-10-CM | POA: Diagnosis not present

## 2023-09-19 DIAGNOSIS — C771 Secondary and unspecified malignant neoplasm of intrathoracic lymph nodes: Secondary | ICD-10-CM | POA: Diagnosis not present

## 2023-09-19 DIAGNOSIS — C3481 Malignant neoplasm of overlapping sites of right bronchus and lung: Secondary | ICD-10-CM | POA: Diagnosis not present

## 2023-09-19 DIAGNOSIS — M48061 Spinal stenosis, lumbar region without neurogenic claudication: Secondary | ICD-10-CM | POA: Diagnosis not present

## 2023-09-19 DIAGNOSIS — Z923 Personal history of irradiation: Secondary | ICD-10-CM | POA: Diagnosis not present

## 2023-09-20 DIAGNOSIS — Z923 Personal history of irradiation: Secondary | ICD-10-CM | POA: Diagnosis not present

## 2023-09-20 DIAGNOSIS — M48061 Spinal stenosis, lumbar region without neurogenic claudication: Secondary | ICD-10-CM | POA: Diagnosis not present

## 2023-09-20 DIAGNOSIS — R590 Localized enlarged lymph nodes: Secondary | ICD-10-CM | POA: Diagnosis not present

## 2023-09-20 DIAGNOSIS — C771 Secondary and unspecified malignant neoplasm of intrathoracic lymph nodes: Secondary | ICD-10-CM | POA: Diagnosis not present

## 2023-09-20 DIAGNOSIS — J984 Other disorders of lung: Secondary | ICD-10-CM | POA: Diagnosis not present

## 2023-09-20 DIAGNOSIS — M47814 Spondylosis without myelopathy or radiculopathy, thoracic region: Secondary | ICD-10-CM | POA: Diagnosis not present

## 2023-09-20 DIAGNOSIS — C3481 Malignant neoplasm of overlapping sites of right bronchus and lung: Secondary | ICD-10-CM | POA: Diagnosis not present

## 2023-09-20 DIAGNOSIS — R748 Abnormal levels of other serum enzymes: Secondary | ICD-10-CM | POA: Diagnosis not present

## 2023-09-21 DIAGNOSIS — C3481 Malignant neoplasm of overlapping sites of right bronchus and lung: Secondary | ICD-10-CM | POA: Diagnosis not present

## 2023-09-21 DIAGNOSIS — C3411 Malignant neoplasm of upper lobe, right bronchus or lung: Secondary | ICD-10-CM | POA: Diagnosis not present

## 2023-09-21 DIAGNOSIS — M47814 Spondylosis without myelopathy or radiculopathy, thoracic region: Secondary | ICD-10-CM | POA: Diagnosis not present

## 2023-09-21 DIAGNOSIS — J984 Other disorders of lung: Secondary | ICD-10-CM | POA: Diagnosis not present

## 2023-09-21 DIAGNOSIS — R748 Abnormal levels of other serum enzymes: Secondary | ICD-10-CM | POA: Diagnosis not present

## 2023-09-21 DIAGNOSIS — R590 Localized enlarged lymph nodes: Secondary | ICD-10-CM | POA: Diagnosis not present

## 2023-09-21 DIAGNOSIS — M48061 Spinal stenosis, lumbar region without neurogenic claudication: Secondary | ICD-10-CM | POA: Diagnosis not present

## 2023-09-21 DIAGNOSIS — C771 Secondary and unspecified malignant neoplasm of intrathoracic lymph nodes: Secondary | ICD-10-CM | POA: Diagnosis not present

## 2023-09-21 DIAGNOSIS — Z923 Personal history of irradiation: Secondary | ICD-10-CM | POA: Diagnosis not present

## 2023-09-22 DIAGNOSIS — R748 Abnormal levels of other serum enzymes: Secondary | ICD-10-CM | POA: Diagnosis not present

## 2023-09-22 DIAGNOSIS — C771 Secondary and unspecified malignant neoplasm of intrathoracic lymph nodes: Secondary | ICD-10-CM | POA: Diagnosis not present

## 2023-09-22 DIAGNOSIS — J984 Other disorders of lung: Secondary | ICD-10-CM | POA: Diagnosis not present

## 2023-09-22 DIAGNOSIS — M47814 Spondylosis without myelopathy or radiculopathy, thoracic region: Secondary | ICD-10-CM | POA: Diagnosis not present

## 2023-09-22 DIAGNOSIS — M48061 Spinal stenosis, lumbar region without neurogenic claudication: Secondary | ICD-10-CM | POA: Diagnosis not present

## 2023-09-22 DIAGNOSIS — R590 Localized enlarged lymph nodes: Secondary | ICD-10-CM | POA: Diagnosis not present

## 2023-09-22 DIAGNOSIS — Z923 Personal history of irradiation: Secondary | ICD-10-CM | POA: Diagnosis not present

## 2023-09-22 DIAGNOSIS — C3481 Malignant neoplasm of overlapping sites of right bronchus and lung: Secondary | ICD-10-CM | POA: Diagnosis not present

## 2023-09-23 DIAGNOSIS — Z923 Personal history of irradiation: Secondary | ICD-10-CM | POA: Diagnosis not present

## 2023-09-23 DIAGNOSIS — R590 Localized enlarged lymph nodes: Secondary | ICD-10-CM | POA: Diagnosis not present

## 2023-09-23 DIAGNOSIS — R748 Abnormal levels of other serum enzymes: Secondary | ICD-10-CM | POA: Diagnosis not present

## 2023-09-23 DIAGNOSIS — M47814 Spondylosis without myelopathy or radiculopathy, thoracic region: Secondary | ICD-10-CM | POA: Diagnosis not present

## 2023-09-23 DIAGNOSIS — J984 Other disorders of lung: Secondary | ICD-10-CM | POA: Diagnosis not present

## 2023-09-23 DIAGNOSIS — M48061 Spinal stenosis, lumbar region without neurogenic claudication: Secondary | ICD-10-CM | POA: Diagnosis not present

## 2023-09-23 DIAGNOSIS — C771 Secondary and unspecified malignant neoplasm of intrathoracic lymph nodes: Secondary | ICD-10-CM | POA: Diagnosis not present

## 2023-09-23 DIAGNOSIS — C3481 Malignant neoplasm of overlapping sites of right bronchus and lung: Secondary | ICD-10-CM | POA: Diagnosis not present

## 2023-09-25 DIAGNOSIS — H5213 Myopia, bilateral: Secondary | ICD-10-CM | POA: Diagnosis not present

## 2023-09-26 DIAGNOSIS — M47814 Spondylosis without myelopathy or radiculopathy, thoracic region: Secondary | ICD-10-CM | POA: Diagnosis not present

## 2023-09-26 DIAGNOSIS — C3481 Malignant neoplasm of overlapping sites of right bronchus and lung: Secondary | ICD-10-CM | POA: Diagnosis not present

## 2023-09-26 DIAGNOSIS — R590 Localized enlarged lymph nodes: Secondary | ICD-10-CM | POA: Diagnosis not present

## 2023-09-26 DIAGNOSIS — M48061 Spinal stenosis, lumbar region without neurogenic claudication: Secondary | ICD-10-CM | POA: Diagnosis not present

## 2023-09-26 DIAGNOSIS — J984 Other disorders of lung: Secondary | ICD-10-CM | POA: Diagnosis not present

## 2023-09-26 DIAGNOSIS — R748 Abnormal levels of other serum enzymes: Secondary | ICD-10-CM | POA: Diagnosis not present

## 2023-09-26 DIAGNOSIS — Z923 Personal history of irradiation: Secondary | ICD-10-CM | POA: Diagnosis not present

## 2023-09-26 DIAGNOSIS — C771 Secondary and unspecified malignant neoplasm of intrathoracic lymph nodes: Secondary | ICD-10-CM | POA: Diagnosis not present

## 2023-09-27 DIAGNOSIS — Z510 Encounter for antineoplastic radiation therapy: Secondary | ICD-10-CM | POA: Diagnosis not present

## 2023-09-27 DIAGNOSIS — C3481 Malignant neoplasm of overlapping sites of right bronchus and lung: Secondary | ICD-10-CM | POA: Diagnosis not present

## 2023-09-27 DIAGNOSIS — C771 Secondary and unspecified malignant neoplasm of intrathoracic lymph nodes: Secondary | ICD-10-CM | POA: Diagnosis not present

## 2023-09-27 DIAGNOSIS — C3411 Malignant neoplasm of upper lobe, right bronchus or lung: Secondary | ICD-10-CM | POA: Diagnosis not present

## 2023-09-28 DIAGNOSIS — C3481 Malignant neoplasm of overlapping sites of right bronchus and lung: Secondary | ICD-10-CM | POA: Diagnosis not present

## 2023-09-28 DIAGNOSIS — C771 Secondary and unspecified malignant neoplasm of intrathoracic lymph nodes: Secondary | ICD-10-CM | POA: Diagnosis not present

## 2023-09-28 DIAGNOSIS — Z510 Encounter for antineoplastic radiation therapy: Secondary | ICD-10-CM | POA: Diagnosis not present

## 2023-09-29 DIAGNOSIS — Z5111 Encounter for antineoplastic chemotherapy: Secondary | ICD-10-CM | POA: Diagnosis not present

## 2023-09-29 DIAGNOSIS — Z510 Encounter for antineoplastic radiation therapy: Secondary | ICD-10-CM | POA: Diagnosis not present

## 2023-09-29 DIAGNOSIS — C3481 Malignant neoplasm of overlapping sites of right bronchus and lung: Secondary | ICD-10-CM | POA: Diagnosis not present

## 2023-09-29 DIAGNOSIS — C771 Secondary and unspecified malignant neoplasm of intrathoracic lymph nodes: Secondary | ICD-10-CM | POA: Diagnosis not present

## 2023-10-03 DIAGNOSIS — C3481 Malignant neoplasm of overlapping sites of right bronchus and lung: Secondary | ICD-10-CM | POA: Diagnosis not present

## 2023-10-03 DIAGNOSIS — Z510 Encounter for antineoplastic radiation therapy: Secondary | ICD-10-CM | POA: Diagnosis not present

## 2023-10-03 DIAGNOSIS — C771 Secondary and unspecified malignant neoplasm of intrathoracic lymph nodes: Secondary | ICD-10-CM | POA: Diagnosis not present

## 2023-10-04 DIAGNOSIS — C3481 Malignant neoplasm of overlapping sites of right bronchus and lung: Secondary | ICD-10-CM | POA: Diagnosis not present

## 2023-10-04 DIAGNOSIS — Z510 Encounter for antineoplastic radiation therapy: Secondary | ICD-10-CM | POA: Diagnosis not present

## 2023-10-04 DIAGNOSIS — C771 Secondary and unspecified malignant neoplasm of intrathoracic lymph nodes: Secondary | ICD-10-CM | POA: Diagnosis not present

## 2023-10-04 DIAGNOSIS — R1032 Left lower quadrant pain: Secondary | ICD-10-CM | POA: Diagnosis not present

## 2023-10-04 DIAGNOSIS — Z6823 Body mass index (BMI) 23.0-23.9, adult: Secondary | ICD-10-CM | POA: Diagnosis not present

## 2023-10-04 DIAGNOSIS — C3411 Malignant neoplasm of upper lobe, right bronchus or lung: Secondary | ICD-10-CM | POA: Diagnosis not present

## 2023-10-04 DIAGNOSIS — K921 Melena: Secondary | ICD-10-CM | POA: Diagnosis not present

## 2023-10-05 DIAGNOSIS — C771 Secondary and unspecified malignant neoplasm of intrathoracic lymph nodes: Secondary | ICD-10-CM | POA: Diagnosis not present

## 2023-10-05 DIAGNOSIS — Z510 Encounter for antineoplastic radiation therapy: Secondary | ICD-10-CM | POA: Diagnosis not present

## 2023-10-05 DIAGNOSIS — C3481 Malignant neoplasm of overlapping sites of right bronchus and lung: Secondary | ICD-10-CM | POA: Diagnosis not present

## 2023-10-06 DIAGNOSIS — C3481 Malignant neoplasm of overlapping sites of right bronchus and lung: Secondary | ICD-10-CM | POA: Diagnosis not present

## 2023-10-06 DIAGNOSIS — Z5111 Encounter for antineoplastic chemotherapy: Secondary | ICD-10-CM | POA: Diagnosis not present

## 2023-10-06 DIAGNOSIS — Z510 Encounter for antineoplastic radiation therapy: Secondary | ICD-10-CM | POA: Diagnosis not present

## 2023-10-06 DIAGNOSIS — C771 Secondary and unspecified malignant neoplasm of intrathoracic lymph nodes: Secondary | ICD-10-CM | POA: Diagnosis not present

## 2023-10-07 DIAGNOSIS — C771 Secondary and unspecified malignant neoplasm of intrathoracic lymph nodes: Secondary | ICD-10-CM | POA: Diagnosis not present

## 2023-10-07 DIAGNOSIS — C3481 Malignant neoplasm of overlapping sites of right bronchus and lung: Secondary | ICD-10-CM | POA: Diagnosis not present

## 2023-10-07 DIAGNOSIS — Z510 Encounter for antineoplastic radiation therapy: Secondary | ICD-10-CM | POA: Diagnosis not present

## 2023-10-10 DIAGNOSIS — Z510 Encounter for antineoplastic radiation therapy: Secondary | ICD-10-CM | POA: Diagnosis not present

## 2023-10-10 DIAGNOSIS — C771 Secondary and unspecified malignant neoplasm of intrathoracic lymph nodes: Secondary | ICD-10-CM | POA: Diagnosis not present

## 2023-10-10 DIAGNOSIS — C3481 Malignant neoplasm of overlapping sites of right bronchus and lung: Secondary | ICD-10-CM | POA: Diagnosis not present

## 2023-10-11 DIAGNOSIS — C3481 Malignant neoplasm of overlapping sites of right bronchus and lung: Secondary | ICD-10-CM | POA: Diagnosis not present

## 2023-10-11 DIAGNOSIS — Z510 Encounter for antineoplastic radiation therapy: Secondary | ICD-10-CM | POA: Diagnosis not present

## 2023-10-11 DIAGNOSIS — C771 Secondary and unspecified malignant neoplasm of intrathoracic lymph nodes: Secondary | ICD-10-CM | POA: Diagnosis not present

## 2023-10-12 DIAGNOSIS — Z510 Encounter for antineoplastic radiation therapy: Secondary | ICD-10-CM | POA: Diagnosis not present

## 2023-10-12 DIAGNOSIS — C771 Secondary and unspecified malignant neoplasm of intrathoracic lymph nodes: Secondary | ICD-10-CM | POA: Diagnosis not present

## 2023-10-12 DIAGNOSIS — C3481 Malignant neoplasm of overlapping sites of right bronchus and lung: Secondary | ICD-10-CM | POA: Diagnosis not present

## 2023-10-13 DIAGNOSIS — Z510 Encounter for antineoplastic radiation therapy: Secondary | ICD-10-CM | POA: Diagnosis not present

## 2023-10-13 DIAGNOSIS — C771 Secondary and unspecified malignant neoplasm of intrathoracic lymph nodes: Secondary | ICD-10-CM | POA: Diagnosis not present

## 2023-10-13 DIAGNOSIS — C3481 Malignant neoplasm of overlapping sites of right bronchus and lung: Secondary | ICD-10-CM | POA: Diagnosis not present

## 2023-10-13 DIAGNOSIS — Z5111 Encounter for antineoplastic chemotherapy: Secondary | ICD-10-CM | POA: Diagnosis not present

## 2023-10-17 DIAGNOSIS — C3481 Malignant neoplasm of overlapping sites of right bronchus and lung: Secondary | ICD-10-CM | POA: Diagnosis not present

## 2023-10-17 DIAGNOSIS — C771 Secondary and unspecified malignant neoplasm of intrathoracic lymph nodes: Secondary | ICD-10-CM | POA: Diagnosis not present

## 2023-10-18 DIAGNOSIS — C771 Secondary and unspecified malignant neoplasm of intrathoracic lymph nodes: Secondary | ICD-10-CM | POA: Diagnosis not present

## 2023-10-18 DIAGNOSIS — Z7982 Long term (current) use of aspirin: Secondary | ICD-10-CM | POA: Diagnosis not present

## 2023-10-18 DIAGNOSIS — C3481 Malignant neoplasm of overlapping sites of right bronchus and lung: Secondary | ICD-10-CM | POA: Diagnosis not present

## 2023-10-20 DIAGNOSIS — C771 Secondary and unspecified malignant neoplasm of intrathoracic lymph nodes: Secondary | ICD-10-CM | POA: Diagnosis not present

## 2023-10-20 DIAGNOSIS — C3481 Malignant neoplasm of overlapping sites of right bronchus and lung: Secondary | ICD-10-CM | POA: Diagnosis not present

## 2023-10-24 DIAGNOSIS — C3481 Malignant neoplasm of overlapping sites of right bronchus and lung: Secondary | ICD-10-CM | POA: Diagnosis not present

## 2023-10-24 DIAGNOSIS — C771 Secondary and unspecified malignant neoplasm of intrathoracic lymph nodes: Secondary | ICD-10-CM | POA: Diagnosis not present

## 2023-10-24 DIAGNOSIS — Z510 Encounter for antineoplastic radiation therapy: Secondary | ICD-10-CM | POA: Diagnosis not present

## 2023-10-25 DIAGNOSIS — C3481 Malignant neoplasm of overlapping sites of right bronchus and lung: Secondary | ICD-10-CM | POA: Diagnosis not present

## 2023-10-25 DIAGNOSIS — Z510 Encounter for antineoplastic radiation therapy: Secondary | ICD-10-CM | POA: Diagnosis not present

## 2023-10-25 DIAGNOSIS — C771 Secondary and unspecified malignant neoplasm of intrathoracic lymph nodes: Secondary | ICD-10-CM | POA: Diagnosis not present

## 2023-10-26 DIAGNOSIS — Z510 Encounter for antineoplastic radiation therapy: Secondary | ICD-10-CM | POA: Diagnosis not present

## 2023-10-26 DIAGNOSIS — C3481 Malignant neoplasm of overlapping sites of right bronchus and lung: Secondary | ICD-10-CM | POA: Diagnosis not present

## 2023-10-26 DIAGNOSIS — C771 Secondary and unspecified malignant neoplasm of intrathoracic lymph nodes: Secondary | ICD-10-CM | POA: Diagnosis not present

## 2023-10-27 DIAGNOSIS — C771 Secondary and unspecified malignant neoplasm of intrathoracic lymph nodes: Secondary | ICD-10-CM | POA: Diagnosis not present

## 2023-10-27 DIAGNOSIS — C3481 Malignant neoplasm of overlapping sites of right bronchus and lung: Secondary | ICD-10-CM | POA: Diagnosis not present

## 2023-10-27 DIAGNOSIS — Z5111 Encounter for antineoplastic chemotherapy: Secondary | ICD-10-CM | POA: Diagnosis not present

## 2023-10-27 DIAGNOSIS — Z510 Encounter for antineoplastic radiation therapy: Secondary | ICD-10-CM | POA: Diagnosis not present

## 2023-10-28 DIAGNOSIS — C771 Secondary and unspecified malignant neoplasm of intrathoracic lymph nodes: Secondary | ICD-10-CM | POA: Diagnosis not present

## 2023-10-28 DIAGNOSIS — C3481 Malignant neoplasm of overlapping sites of right bronchus and lung: Secondary | ICD-10-CM | POA: Diagnosis not present

## 2023-10-28 DIAGNOSIS — Z510 Encounter for antineoplastic radiation therapy: Secondary | ICD-10-CM | POA: Diagnosis not present

## 2023-10-31 DIAGNOSIS — Z923 Personal history of irradiation: Secondary | ICD-10-CM | POA: Diagnosis not present

## 2023-10-31 DIAGNOSIS — R748 Abnormal levels of other serum enzymes: Secondary | ICD-10-CM | POA: Diagnosis not present

## 2023-10-31 DIAGNOSIS — J984 Other disorders of lung: Secondary | ICD-10-CM | POA: Diagnosis not present

## 2023-10-31 DIAGNOSIS — C3481 Malignant neoplasm of overlapping sites of right bronchus and lung: Secondary | ICD-10-CM | POA: Diagnosis not present

## 2023-10-31 DIAGNOSIS — C771 Secondary and unspecified malignant neoplasm of intrathoracic lymph nodes: Secondary | ICD-10-CM | POA: Diagnosis not present

## 2023-10-31 DIAGNOSIS — M48061 Spinal stenosis, lumbar region without neurogenic claudication: Secondary | ICD-10-CM | POA: Diagnosis not present

## 2023-10-31 DIAGNOSIS — R590 Localized enlarged lymph nodes: Secondary | ICD-10-CM | POA: Diagnosis not present

## 2023-10-31 DIAGNOSIS — M47814 Spondylosis without myelopathy or radiculopathy, thoracic region: Secondary | ICD-10-CM | POA: Diagnosis not present

## 2023-10-31 DIAGNOSIS — Z510 Encounter for antineoplastic radiation therapy: Secondary | ICD-10-CM | POA: Diagnosis not present

## 2023-11-01 DIAGNOSIS — C771 Secondary and unspecified malignant neoplasm of intrathoracic lymph nodes: Secondary | ICD-10-CM | POA: Diagnosis not present

## 2023-11-01 DIAGNOSIS — C3481 Malignant neoplasm of overlapping sites of right bronchus and lung: Secondary | ICD-10-CM | POA: Diagnosis not present

## 2023-11-01 DIAGNOSIS — Z510 Encounter for antineoplastic radiation therapy: Secondary | ICD-10-CM | POA: Diagnosis not present

## 2023-11-02 DIAGNOSIS — C771 Secondary and unspecified malignant neoplasm of intrathoracic lymph nodes: Secondary | ICD-10-CM | POA: Diagnosis not present

## 2023-11-02 DIAGNOSIS — Z510 Encounter for antineoplastic radiation therapy: Secondary | ICD-10-CM | POA: Diagnosis not present

## 2023-11-02 DIAGNOSIS — C3481 Malignant neoplasm of overlapping sites of right bronchus and lung: Secondary | ICD-10-CM | POA: Diagnosis not present

## 2023-11-03 DIAGNOSIS — Z510 Encounter for antineoplastic radiation therapy: Secondary | ICD-10-CM | POA: Diagnosis not present

## 2023-11-03 DIAGNOSIS — C771 Secondary and unspecified malignant neoplasm of intrathoracic lymph nodes: Secondary | ICD-10-CM | POA: Diagnosis not present

## 2023-11-03 DIAGNOSIS — C3481 Malignant neoplasm of overlapping sites of right bronchus and lung: Secondary | ICD-10-CM | POA: Diagnosis not present

## 2023-11-04 DIAGNOSIS — C3481 Malignant neoplasm of overlapping sites of right bronchus and lung: Secondary | ICD-10-CM | POA: Diagnosis not present

## 2023-11-04 DIAGNOSIS — C771 Secondary and unspecified malignant neoplasm of intrathoracic lymph nodes: Secondary | ICD-10-CM | POA: Diagnosis not present

## 2023-11-04 DIAGNOSIS — Z510 Encounter for antineoplastic radiation therapy: Secondary | ICD-10-CM | POA: Diagnosis not present

## 2023-11-07 ENCOUNTER — Telehealth: Payer: Self-pay

## 2023-11-07 NOTE — Telephone Encounter (Signed)
 Hi Mia, Mr. Trudo is not a patient at our practice, he sees Rocky Don at [facility]. Thank you. Ronal Bradley Copied from CRM [id number]. Topic: Clinical - Lab/Test Results >> Nov 07, 2023 11:12 AM Mia F wrote: Reason for CRM: Pt returning office call about his lab results. I do not see results available to read or any messages. Please advise

## 2023-11-25 DIAGNOSIS — R1032 Left lower quadrant pain: Secondary | ICD-10-CM | POA: Diagnosis not present

## 2023-11-25 DIAGNOSIS — Z6821 Body mass index (BMI) 21.0-21.9, adult: Secondary | ICD-10-CM | POA: Diagnosis not present

## 2023-12-09 DIAGNOSIS — Z923 Personal history of irradiation: Secondary | ICD-10-CM | POA: Diagnosis not present

## 2023-12-09 DIAGNOSIS — C771 Secondary and unspecified malignant neoplasm of intrathoracic lymph nodes: Secondary | ICD-10-CM | POA: Diagnosis not present

## 2023-12-09 DIAGNOSIS — C3481 Malignant neoplasm of overlapping sites of right bronchus and lung: Secondary | ICD-10-CM | POA: Diagnosis not present

## 2023-12-09 DIAGNOSIS — Z9221 Personal history of antineoplastic chemotherapy: Secondary | ICD-10-CM | POA: Diagnosis not present

## 2023-12-12 DIAGNOSIS — Z923 Personal history of irradiation: Secondary | ICD-10-CM | POA: Diagnosis not present

## 2023-12-12 DIAGNOSIS — R748 Abnormal levels of other serum enzymes: Secondary | ICD-10-CM | POA: Diagnosis not present

## 2023-12-12 DIAGNOSIS — M47814 Spondylosis without myelopathy or radiculopathy, thoracic region: Secondary | ICD-10-CM | POA: Diagnosis not present

## 2023-12-12 DIAGNOSIS — J984 Other disorders of lung: Secondary | ICD-10-CM | POA: Diagnosis not present

## 2023-12-12 DIAGNOSIS — C771 Secondary and unspecified malignant neoplasm of intrathoracic lymph nodes: Secondary | ICD-10-CM | POA: Diagnosis not present

## 2023-12-12 DIAGNOSIS — M48061 Spinal stenosis, lumbar region without neurogenic claudication: Secondary | ICD-10-CM | POA: Diagnosis not present

## 2023-12-12 DIAGNOSIS — R590 Localized enlarged lymph nodes: Secondary | ICD-10-CM | POA: Diagnosis not present

## 2023-12-12 DIAGNOSIS — C3481 Malignant neoplasm of overlapping sites of right bronchus and lung: Secondary | ICD-10-CM | POA: Diagnosis not present

## 2024-01-24 ENCOUNTER — Encounter: Payer: Self-pay | Admitting: Gastroenterology

## 2024-02-29 ENCOUNTER — Ambulatory Visit: Admitting: Gastroenterology

## 2024-02-29 ENCOUNTER — Encounter: Payer: Self-pay | Admitting: Gastroenterology

## 2024-02-29 VITALS — BP 106/70 | HR 93 | Temp 98.0°F | Ht 71.0 in | Wt 153.4 lb

## 2024-02-29 DIAGNOSIS — R1032 Left lower quadrant pain: Secondary | ICD-10-CM | POA: Insufficient documentation

## 2024-02-29 DIAGNOSIS — G8929 Other chronic pain: Secondary | ICD-10-CM | POA: Insufficient documentation

## 2024-02-29 DIAGNOSIS — K219 Gastro-esophageal reflux disease without esophagitis: Secondary | ICD-10-CM | POA: Diagnosis not present

## 2024-02-29 DIAGNOSIS — R198 Other specified symptoms and signs involving the digestive system and abdomen: Secondary | ICD-10-CM | POA: Insufficient documentation

## 2024-02-29 DIAGNOSIS — R634 Abnormal weight loss: Secondary | ICD-10-CM | POA: Diagnosis not present

## 2024-02-29 DIAGNOSIS — K529 Noninfective gastroenteritis and colitis, unspecified: Secondary | ICD-10-CM | POA: Diagnosis not present

## 2024-02-29 DIAGNOSIS — T451X5A Adverse effect of antineoplastic and immunosuppressive drugs, initial encounter: Secondary | ICD-10-CM

## 2024-02-29 NOTE — Patient Instructions (Signed)
 We will arrange for appointment with Dr. Mavis to assess site of your previous left inguinal hernia and your left lower abdominal pain.  Monitor your stools over the next few days. Reach out to us  on Monday, if you are still having loose stool, we would advise further work up.  For chronic acid reflux, consider upper endoscopy to look at your esophagus. Given your chronic reflux and family history, you are at increased risk and should have the esophagus looked at by scope.  Continue pantoprazole 40mg  daily.

## 2024-02-29 NOTE — Progress Notes (Unsigned)
 GI Office Note    Referring Provider: Lauraine Louder, NP (hem/onc) Primary Care Physician:  Dow Longs, PA-C  Primary Gastroenterologist: Ozell Hollingshead, MD   Chief Complaint   Chief Complaint  Patient presents with   Constipation    States that he is here because of lower abd pain and trouble going to the bathroom.     History of Present Illness   Terrence Swanson is a 63 y.o. male presenting today at the request of Lauraine Louder, NP for esophagitis, dysphagia, weight loss consider EGD.  He has a history of adenocarcinoma of the right upper lobe of the lung treated with SBRT which was completed April 27, 2023.  He had either stage IIIa or II synchronous early-stage cancers in the right upper and right middle lobes.  Bronchoscopy with biopsy Aug 24, 2023 showed recurrent non-small cell lung carcinoma.  He completed combined RT and chemotherapy with weekly carboplatin  and Taxol  on November 04, 2023. Radiation from 09/15/2023 through 11/04/2023. Currently followed by oncololy at  [facility].   Discussed the use of AI scribe software for clinical note transcription with the patient, who gave verbal consent to proceed.  History of Present Illness Terrence Swanson is a 63 year old male with referral for esophagitis and swallowing difficulties in setting of radiation who presents with gastrointestinal symptoms post-radiation and chemotherapy. Patient states he is here for bowel concerns and left lower abdominal pain for past 8 months.  He has experienced significant gastrointestinal issues following radiation and chemotherapy treatments completed on November 01, 2023. Severe esophagitis initially prevented him from eating or drinking, leading to a weight loss of 50 pounds over two months. He has been on pantoprazole since the 1990s for acid reflux, which he continues to take once daily. He survives off of liquids and IVs. His weight has stabilized around 153 pounds, down from 201 pounds  before treatment. At his lowest he weighed 150 pounds.  He states his swallowing and throat/esophagus pain have resolved. He denies dysphagia. Eating better. No heartburn. No vomiting.   He describes a trajectory of bowel issues beginning with constipation when he first resumed eating few months ago. He required Miralax to relieve blockages. This transitioned to diarrhea, characterized by urgency and gas, necessitating proximity to a bathroom. He has used imodium with good relief. He notes he has not had to use imodium for awhile. Definitely none in the past couple of weeks. Today is the first time he has had a solid stool since July. He notes occasional brbpr which he believes his hemorrhoid related.    He reports a persistent pain in the lower left abdomen, similar to a previous hernia repaired three years ago, but without a noticeable bulge. This pain began after a fall in March 2025, which resulted in a spinal fracture and subsequent mishandling during hospital transfer. The pain intensified with eating about a month ago but his pain is no longer postprandial. He notes pain does seem to improve some with BM.  He has a family history of esophageal cancer, with his father having died from the disease. He has not had a colonoscopy or endoscopy previously. 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      Prior Data   Results        CT chest with contrast 12/09/23: IMPRESSION:  1.   Treated primary malignancies in the right upper and middle lobes without evidence  of local recurrence.  2.    Thoracic lymphadenopathy resolved.  3.    No evidence of new metastatic disease.   Medications   Current Outpatient Medications  Medication Sig Dispense Refill   aspirin  EC 81 MG tablet Take 1 tablet (81 mg total) by mouth daily. (Patient taking differently: Take 81 mg by mouth at bedtime.)     atorvastatin  (LIPITOR) 40 MG tablet Take 1  tablet (40 mg total) by mouth daily. (Patient taking differently: Take 40 mg by mouth daily at 6 PM.) 30 tablet 10   clonazePAM (KLONOPIN) 1 MG tablet Take 1 mg by mouth every 8 (eight) hours as needed for anxiety.     hydrocortisone cream 1 % Apply 1 application  topically 2 (two) times daily as needed (skin irritation/itching).     pantoprazole (PROTONIX) 40 MG tablet Take 40 mg by mouth in the morning.     No current facility-administered medications for this visit.    Allergies   Allergies as of 02/29/2024 - Review Complete 02/29/2024  Allergen Reaction Noted   Duloxetine Other (See Comments) 06/28/2023    Past Medical History   Past Medical History:  Diagnosis Date   Anxiety    Arthritis    Asthma    Cancer (HCC)    Lung Cancer   Complication of anesthesia    difficult to wake up after anesthesia   COPD (chronic obstructive pulmonary disease) (HCC)    Depression    Diverticulosis    GERD (gastroesophageal reflux disease)    Herniated lumbar intervertebral disc    Labral tear of shoulder    left shoulder   Peripheral vascular disease     Past Surgical History   Past Surgical History:  Procedure Laterality Date   ABDOMINAL AORTAGRAM N/A 07/24/2014   Procedure: ABDOMINAL EZELLA;  Surgeon: Deatrice DELENA Cage, MD;  Location: [facility] CATH LAB;  Service: Cardiovascular;  Laterality: N/A;   ABDOMINAL AORTOGRAM N/A 07/28/2016   Procedure: Abdominal Aortogram;  Surgeon: Deatrice DELENA Cage, MD;  Location: [facility] INVASIVE CV LAB;  Service: Cardiovascular;  Laterality: N/A;   INGUINAL HERNIA REPAIR Left 03/18/2021   Procedure: LEFT INGUINAL HERNIA REAPAIR W/ MESH (ADULT);  Surgeon: Mavis Anes, MD;  Location: [facility] ORS;  Service: General;  Laterality: Left;   LEFT HEART CATH AND CORONARY ANGIOGRAPHY N/A 07/27/2019   Procedure: LEFT HEART CATH AND CORONARY ANGIOGRAPHY;  Surgeon: Cage Deatrice DELENA, MD;  Location: [facility] INVASIVE CV LAB;  Service: Cardiovascular;  Laterality: N/A;   PERCUTANEOUS  STENT INTERVENTION N/A 07/24/2014   Procedure: PERCUTANEOUS STENT INTERVENTION;  Surgeon: Deatrice DELENA Cage, MD;  Location: [facility] CATH [facility];  Service: Cardiovascular;  Laterality: N/A;  rt and left common iliacs   PERIPHERAL VASCULAR INTERVENTION Bilateral 07/28/2016   Procedure: Peripheral Vascular Intervention;  Surgeon: Deatrice DELENA Cage, MD;  Location: [facility] INVASIVE CV LAB;  Service: Cardiovascular;  Laterality: Bilateral;  COMMON ILIACS   VIDEO BRONCHOSCOPY WITH ENDOBRONCHIAL NAVIGATION N/A 03/04/2023   Procedure: VIDEO BRONCHOSCOPY WITH ENDOBRONCHIAL NAVIGATION;  Surgeon: Kerrin Elspeth BROCKS, MD;  Location: [facility] OR;  Service: Thoracic;  Laterality: N/A;   VIDEO BRONCHOSCOPY WITH ENDOBRONCHIAL ULTRASOUND N/A 08/24/2023   Procedure: BRONCHOSCOPY, WITH ENDOBRONCHIAL ULTRASOUND;  Surgeon: Kerrin Elspeth BROCKS, MD;  Location: [facility] OR;  Service: Thoracic;  Laterality: N/A;    Past Family History   Family History  Problem Relation Age of Onset   Diabetes Mother    Cancer Father        Throat    Past  Social History   Social History   Socioeconomic History   Marital status: Divorced    Spouse name: Not on file   Number of children: 2   Years of education: Not on file   Highest education level: Not on file  Occupational History   Not on file  Tobacco Use   Smoking status: Former    Current packs/day: 0.00    Average packs/day: 1.5 packs/day for 45.0 years (67.5 ttl pk-yrs)    Types: Cigarettes    Start date: 08/27/1973    Quit date: 08/28/2018    Years since quitting: 5.5   Smokeless tobacco: Never  Vaping Use   Vaping status: Never Used  Substance and Sexual Activity   Alcohol use: Not Currently   Drug use: Not Currently   Sexual activity: Not Currently  Other Topics Concern   Not on file  Social History Narrative   Divorced but has lived with ex wife for the past 18 months and takes care of her (due to her having a stroke) 03/02/23   Social Drivers of Health   Financial Resource  Strain: Low Risk (06/09/2023)   Received from [facility]   Overall Financial Resource Strain (CARDIA)    Difficulty of Paying Living Expenses: Not hard at all  Food Insecurity: No Food Insecurity (07/20/2023)   Received from [facility]   Hunger Vital Sign    Within the past 12 months, you worried that your food would run out before you got the money to buy more.: Never true    Within the past 12 months, the food you bought just didn't last and you didn't have money to get more.: Never true  Transportation Needs: No Transportation Needs (07/20/2023)   Received from [facility]   PRAPARE - Transportation    Lack of Transportation (Medical): No    Lack of Transportation (Non-Medical): No  Physical Activity: Not on file  Stress: Not on file  Social Connections: Not on file  Intimate Partner Violence: Not At Risk (02/20/2024)   Received from [facility]   Humiliation, Afraid, Rape, and Kick questionnaire    Within the last year, have you been afraid of your partner or ex-partner?: No    Within the last year, have you been humiliated or emotionally abused in other ways by your partner or ex-partner?: No    Within the last year, have you been kicked, hit, slapped, or otherwise physically hurt by your partner or ex-partner?: No    Within the last year, have you been raped or forced to have any kind of sexual activity by your partner or ex-partner?: No    Review of Systems   General: Negative for anorexia, weight loss, fever, chills, fatigue, weakness. See hpi Eyes: Negative for vision changes.  ENT: Negative for hoarseness, difficulty swallowing , nasal congestion.see hpi CV: Negative for chest pain, angina, palpitations, dyspnea on exertion, peripheral edema.  Respiratory: Negative for dyspnea at rest, dyspnea on exertion, cough, sputum, wheezing.  GI: See history of present illness. GU:  Negative for dysuria, hematuria, urinary incontinence, urinary frequency, nocturnal  urination.  MS: Negative for joint pain, low back pain.  Derm: Negative for rash or itching.  Neuro: Negative for weakness, abnormal sensation, seizure, frequent headaches, memory loss,  confusion.  Psych: Negative for anxiety, depression, suicidal ideation, hallucinations.  Endo: Negative for unusual weight change.  Heme: Negative for bruising or bleeding. Allergy: Negative for rash or hives.  Physical Exam  BP 106/70   Pulse 93   Temp 98 F (36.7 C) (Oral)   Ht 5' 11 (1.803 m)   Wt 153 lb 6.4 oz (69.6 kg)   SpO2 100%   BMI 21.39 kg/m    General: thin appears older than stated age in no acute distress.  Head: Normocephalic, atraumatic.   Eyes: Conjunctiva pink, no icterus. Mouth: Oropharyngeal mucosa moist and pink  Neck: Supple without thyromegaly, masses, or lymphadenopathy.  Lungs: Clear to auscultation bilaterally.  Heart: Regular rate and rhythm, no murmurs rubs or gallops.  Abdomen: Bowel sounds are normal,  nondistended, no hepatosplenomegaly or masses,  no abdominal bruits or hernia, no rebound or guarding. focal tenderness above left lower abdomen scar, no bulge appreciated, patient with subjective guarding in focal area with palpation, reproduced multiple times on exam  Rectal: not performed Extremities: No lower extremity edema. No clubbing or deformities.  Neuro: Alert and oriented x 4 , grossly normal neurologically.  Skin: Warm and dry, no rash or jaundice.   Psych: Alert and cooperative, normal mood and affect.  Labs   WBC 8, Hgb 14.5, Plt 217, cre 0.86, na 144, alb 3.5, Tbili 0.4, AP 125, AST 19, ALT 18 Imaging Studies   No results found.  Assessment/Plan:    Assessment & Plan Chronic diarrhea and gas following chemotherapy and radiation Symptoms improving. No infection signs. Possible bacterial imbalance. Likely multifactorial also in setting of chronic limited oral intake. Having first solid stools in the past 24 hours.  - Monitor bowel movements  over the weekend and report Monday. - we have discussed possible colonoscopy, no prior. He is unsure he wants to proceed right at the moment as he is just recovering from is radiation/chemotherapy. Would recommend sooner colonoscopy if no improvement.   Focal left lower abdominal pain at prior hernia repair site Pain at previous hernia repair site, possibly due to trauma as described above. I do not notice obvious bulge. He has focal point tenderness above hernia scar. ?tear, adhesions? - Refer to Dr. Mavis for evaluation. - Consider imaging if recommended by Dr. Mavis.  Chronic gastroesophageal reflux disease (GERD) Chronic GERD managed with pantoprazole. Radiation exacerbated symptoms, now improved. Risk of Barrett's esophagus due to long-standing GERD and family history of esophageal cancer (father). - Continue pantoprazole once daily. - Consider upper endoscopy for Barrett's esophagus. Patient wants to hold off for now, may consider at time of colonoscopy.  Unintentional weight loss after treatment Weight loss post-chemotherapy and radiation, now stabilized at 153 lbs. - Monitor weight and work on nutritional intake.     Terrence Swanson. Ezzard, MHS, PA-C [facility]

## 2024-03-05 ENCOUNTER — Telehealth: Payer: Self-pay | Admitting: Gastroenterology

## 2024-03-05 NOTE — Telephone Encounter (Signed)
 Please let patient know that I discussed concerns regarding his left lower abdominal pain with general surgeon, Dr. Mavis. He is requesting that he evaluate patient prior to any imaging. He will decide if CT of pelvis is needed after examination.   Patient has an appointment next month with Dr. Mavis.

## 2024-03-05 NOTE — Telephone Encounter (Signed)
 Pt was made aware and verbalized understanding.

## 2024-04-03 ENCOUNTER — Ambulatory Visit: Admitting: General Surgery

## 2024-04-03 ENCOUNTER — Encounter: Payer: Self-pay | Admitting: General Surgery

## 2024-04-03 VITALS — BP 92/65 | HR 85 | Temp 97.8°F | Resp 14 | Ht 71.0 in | Wt 151.0 lb

## 2024-04-03 DIAGNOSIS — R1032 Left lower quadrant pain: Secondary | ICD-10-CM | POA: Diagnosis not present

## 2024-04-03 MED ORDER — GABAPENTIN 300 MG PO CAPS: 300.0000 mg | ORAL_CAPSULE | Freq: Three times a day (TID) | ORAL | 2 refills | Status: AC

## 2024-04-04 NOTE — Progress Notes (Signed)
 Terrence Swanson; [id number]; [birth year]   HPI Patient is a 64 year old white male who was referred to my care by Rocky Don for evaluation and treatment of left groin pain.  Patient is status post a left inguinal herniorrhaphy with mesh by myself in 2022.  He states he was doing well until last year when he was hospitalized and developed left groin pain while being moved from a hospital bed.  He denies any lump in this region.  He has been undergoing chemotherapy for lung cancer and has had back issues.  He states the pain is intermittent and made worse with certain movements. Past Medical History:  Diagnosis Date   Anxiety    Arthritis    Asthma    Cancer (HCC)    Lung Cancer   Complication of anesthesia    difficult to wake up after anesthesia   COPD (chronic obstructive pulmonary disease) (HCC)    Depression    Diverticulosis    GERD (gastroesophageal reflux disease)    Herniated lumbar intervertebral disc    Labral tear of shoulder    left shoulder   Peripheral vascular disease     Past Surgical History:  Procedure Laterality Date   ABDOMINAL AORTAGRAM N/A 07/24/2014   Procedure: ABDOMINAL EZELLA;  Surgeon: Deatrice DELENA Cage, MD;  Location: [facility];  Service: Cardiovascular;  Laterality: N/A;   ABDOMINAL AORTOGRAM N/A 07/28/2016   Procedure: Abdominal Aortogram;  Surgeon: Deatrice DELENA Cage, MD;  Location: [facility];  Service: Cardiovascular;  Laterality: N/A;   INGUINAL HERNIA REPAIR Left 03/18/2021   Procedure: LEFT INGUINAL HERNIA REAPAIR W/ MESH (ADULT);  Surgeon: Mavis Anes, MD;  Location: [facility];  Service: General;  Laterality: Left;   LEFT HEART CATH AND CORONARY ANGIOGRAPHY N/A 07/27/2019   Procedure: LEFT HEART CATH AND CORONARY ANGIOGRAPHY;  Surgeon: Cage Deatrice DELENA, MD;  Location: [facility];  Service: Cardiovascular;  Laterality: N/A;   PERCUTANEOUS STENT INTERVENTION N/A 07/24/2014   Procedure: PERCUTANEOUS STENT INTERVENTION;  Surgeon: Deatrice DELENA Cage, MD;  Location: [facility];  Service: Cardiovascular;  Laterality: N/A;  rt and left common iliacs   PERIPHERAL VASCULAR INTERVENTION Bilateral 07/28/2016   Procedure: Peripheral Vascular Intervention;  Surgeon: Deatrice DELENA Cage, MD;  Location: [facility];  Service: Cardiovascular;  Laterality: Bilateral;  COMMON ILIACS   VIDEO BRONCHOSCOPY WITH ENDOBRONCHIAL NAVIGATION N/A 03/04/2023   Procedure: VIDEO BRONCHOSCOPY WITH ENDOBRONCHIAL NAVIGATION;  Surgeon: Kerrin Elspeth BROCKS, MD;  Location: [facility];  Service: Thoracic;  Laterality: N/A;   VIDEO BRONCHOSCOPY WITH ENDOBRONCHIAL ULTRASOUND N/A 08/24/2023   Procedure: BRONCHOSCOPY, WITH ENDOBRONCHIAL ULTRASOUND;  Surgeon: Kerrin Elspeth BROCKS, MD;  Location: [facility];  Service: Thoracic;  Laterality: N/A;    Family History  Problem Relation Age of Onset   Diabetes Mother    Cancer Father        Throat, ?esophageal per patient   Colon cancer Neg Hx     Medications Ordered Prior to Encounter[1]  Allergies[2]  Social History   Substance and Sexual Activity  Alcohol Use Not Currently    Tobacco Use History[3]  Review of Systems  Constitutional: Negative.   HENT: Negative.    Eyes: Negative.   Respiratory:  Positive for shortness of breath.   Cardiovascular: Negative.   Gastrointestinal:  Positive for abdominal pain.  Genitourinary:  Positive for frequency.  Musculoskeletal:  Positive for back pain.  Skin: Negative.   Neurological:  Positive for dizziness.  Psychiatric/Behavioral: Negative.  Objective   Vitals:   04/03/24 1501  BP: 92/65  Pulse: 85  Resp: 14  Temp: 97.8 F (36.6 C)  SpO2: 94%    Physical Exam Vitals reviewed.  Constitutional:      Appearance: Normal appearance. He is normal weight. He is not ill-appearing.  HENT:     Head: Normocephalic and atraumatic.  Cardiovascular:     Rate and Rhythm: Normal rate and regular rhythm.     Heart sounds: Normal heart sounds. No murmur heard.    No  friction rub. No gallop.  Pulmonary:     Effort: Pulmonary effort is normal. No respiratory distress.     Breath sounds: Normal breath sounds. No stridor. No wheezing, rhonchi or rales.  Abdominal:     General: Bowel sounds are normal. There is no distension.     Palpations: Abdomen is soft. There is no mass.     Tenderness: There is abdominal tenderness. There is no guarding or rebound.     Hernia: No hernia is present.     Comments: Patient does have some point tenderness over the left internal inguinal ring.  There was some variability in finding the trigger point.  I do not appreciate a recurrent hernia.  He was examined while lying down and standing up.  Genitourinary:    Testes: Normal.  Skin:    General: Skin is warm and dry.  Neurological:     Mental Status: He is alert and oriented to person, place, and time.   Previous operative note reviewed  Assessment  Left groin pain most likely secondary to musculoskeletal strain.  No evidence of recurrent herniation is noted. Plan  Will try to initially control his pain with gabapentin .  He will contact his oncologist to make sure he can take this.  I will see him again in 5 weeks for follow-up.  No need for surgical intervention at the present time.    [1]  Current Outpatient Medications on File Prior to Visit  Medication Sig Dispense Refill   aspirin  EC 81 MG tablet Take 1 tablet (81 mg total) by mouth daily. (Patient taking differently: Take 81 mg by mouth at bedtime.)     atorvastatin  (LIPITOR) 40 MG tablet Take 1 tablet (40 mg total) by mouth daily. (Patient taking differently: Take 40 mg by mouth daily at 6 PM.) 30 tablet 10   clonazePAM (KLONOPIN) 1 MG tablet Take 1 mg by mouth every 8 (eight) hours as needed for anxiety.     hydrocortisone cream 1 % Apply 1 application  topically 2 (two) times daily as needed (skin irritation/itching).     pantoprazole (PROTONIX) 40 MG tablet Take 40 mg by mouth in the morning.      prednisoLONE acetate (PRED FORTE) 1 % ophthalmic suspension Place 1 drop into the right eye every 4 (four) hours.     No current facility-administered medications on file prior to visit.  [2]  Allergies Allergen Reactions   Duloxetine Other (See Comments)    Severe headaches/dizziness  [3]  Social History Tobacco Use  Smoking Status Former   Current packs/day: 0.00   Average packs/day: 1.5 packs/day for 45.0 years (67.5 ttl pk-yrs)   Types: Cigarettes   Start date: 08/27/1973   Quit date: 08/28/2018   Years since quitting: 5.6  Smokeless Tobacco Never

## 2024-05-08 ENCOUNTER — Ambulatory Visit: Admitting: General Surgery
